# Patient Record
Sex: Female | Born: 1995 | Race: White | Hispanic: No | Marital: Single | Smoking: Never smoker
Health system: Southern US, Community
[De-identification: ages and names within clinical notes are randomized; demographics above are authoritative.]

## PROBLEM LIST (undated history)

## (undated) DIAGNOSIS — G43909 Migraine, unspecified, not intractable, without status migrainosus: Secondary | ICD-10-CM

## (undated) DIAGNOSIS — M5126 Other intervertebral disc displacement, lumbar region: Secondary | ICD-10-CM

## (undated) HISTORY — DX: Other intervertebral disc displacement, lumbar region: M51.26

## (undated) HISTORY — PX: NO PAST SURGERIES: SHX2092

## (undated) HISTORY — DX: Migraine, unspecified, not intractable, without status migrainosus: G43.909

---

## 2016-11-13 DIAGNOSIS — Z01419 Encounter for gynecological examination (general) (routine) without abnormal findings: Secondary | ICD-10-CM | POA: Diagnosis not present

## 2016-12-06 DIAGNOSIS — M5106 Intervertebral disc disorders with myelopathy, lumbar region: Secondary | ICD-10-CM | POA: Diagnosis not present

## 2016-12-15 DIAGNOSIS — M545 Low back pain: Secondary | ICD-10-CM | POA: Diagnosis not present

## 2017-05-03 DIAGNOSIS — Z23 Encounter for immunization: Secondary | ICD-10-CM | POA: Diagnosis not present

## 2017-05-17 DIAGNOSIS — S335XXA Sprain of ligaments of lumbar spine, initial encounter: Secondary | ICD-10-CM | POA: Diagnosis not present

## 2017-05-17 DIAGNOSIS — S079XXA Crushing injury of head, part unspecified, initial encounter: Secondary | ICD-10-CM | POA: Diagnosis not present

## 2017-05-17 DIAGNOSIS — S134XXA Sprain of ligaments of cervical spine, initial encounter: Secondary | ICD-10-CM | POA: Diagnosis not present

## 2017-11-16 DIAGNOSIS — Z01419 Encounter for gynecological examination (general) (routine) without abnormal findings: Secondary | ICD-10-CM | POA: Diagnosis not present

## 2017-11-16 DIAGNOSIS — Z124 Encounter for screening for malignant neoplasm of cervix: Secondary | ICD-10-CM | POA: Diagnosis not present

## 2017-11-29 DIAGNOSIS — M5416 Radiculopathy, lumbar region: Secondary | ICD-10-CM | POA: Diagnosis not present

## 2017-11-29 DIAGNOSIS — Z0000 Encounter for general adult medical examination without abnormal findings: Secondary | ICD-10-CM | POA: Diagnosis not present

## 2017-11-29 DIAGNOSIS — Z1389 Encounter for screening for other disorder: Secondary | ICD-10-CM | POA: Diagnosis not present

## 2018-02-11 DIAGNOSIS — M545 Low back pain: Secondary | ICD-10-CM | POA: Diagnosis not present

## 2018-02-11 DIAGNOSIS — M5136 Other intervertebral disc degeneration, lumbar region: Secondary | ICD-10-CM | POA: Diagnosis not present

## 2018-02-11 DIAGNOSIS — M5416 Radiculopathy, lumbar region: Secondary | ICD-10-CM | POA: Diagnosis not present

## 2018-03-01 DIAGNOSIS — M5416 Radiculopathy, lumbar region: Secondary | ICD-10-CM | POA: Diagnosis not present

## 2018-03-01 DIAGNOSIS — M545 Low back pain: Secondary | ICD-10-CM | POA: Diagnosis not present

## 2018-03-15 DIAGNOSIS — M545 Low back pain: Secondary | ICD-10-CM | POA: Diagnosis not present

## 2018-03-15 DIAGNOSIS — M5416 Radiculopathy, lumbar region: Secondary | ICD-10-CM | POA: Diagnosis not present

## 2018-03-19 DIAGNOSIS — M5416 Radiculopathy, lumbar region: Secondary | ICD-10-CM | POA: Diagnosis not present

## 2018-03-19 DIAGNOSIS — M545 Low back pain: Secondary | ICD-10-CM | POA: Diagnosis not present

## 2018-03-26 DIAGNOSIS — M545 Low back pain: Secondary | ICD-10-CM | POA: Diagnosis not present

## 2018-03-26 DIAGNOSIS — M5416 Radiculopathy, lumbar region: Secondary | ICD-10-CM | POA: Diagnosis not present

## 2018-04-03 DIAGNOSIS — M545 Low back pain: Secondary | ICD-10-CM | POA: Diagnosis not present

## 2018-04-03 DIAGNOSIS — M5416 Radiculopathy, lumbar region: Secondary | ICD-10-CM | POA: Diagnosis not present

## 2018-04-10 DIAGNOSIS — M5416 Radiculopathy, lumbar region: Secondary | ICD-10-CM | POA: Diagnosis not present

## 2018-04-10 DIAGNOSIS — M545 Low back pain: Secondary | ICD-10-CM | POA: Diagnosis not present

## 2018-04-15 DIAGNOSIS — M545 Low back pain: Secondary | ICD-10-CM | POA: Diagnosis not present

## 2018-04-15 DIAGNOSIS — M5416 Radiculopathy, lumbar region: Secondary | ICD-10-CM | POA: Diagnosis not present

## 2018-04-17 DIAGNOSIS — M545 Low back pain: Secondary | ICD-10-CM | POA: Diagnosis not present

## 2018-04-17 DIAGNOSIS — M5416 Radiculopathy, lumbar region: Secondary | ICD-10-CM | POA: Diagnosis not present

## 2018-04-24 DIAGNOSIS — M5416 Radiculopathy, lumbar region: Secondary | ICD-10-CM | POA: Diagnosis not present

## 2018-04-24 DIAGNOSIS — M545 Low back pain: Secondary | ICD-10-CM | POA: Diagnosis not present

## 2018-05-01 DIAGNOSIS — M5416 Radiculopathy, lumbar region: Secondary | ICD-10-CM | POA: Diagnosis not present

## 2018-05-01 DIAGNOSIS — M545 Low back pain: Secondary | ICD-10-CM | POA: Diagnosis not present

## 2018-05-08 DIAGNOSIS — M545 Low back pain: Secondary | ICD-10-CM | POA: Diagnosis not present

## 2018-05-08 DIAGNOSIS — M5416 Radiculopathy, lumbar region: Secondary | ICD-10-CM | POA: Diagnosis not present

## 2018-08-28 DIAGNOSIS — R102 Pelvic and perineal pain: Secondary | ICD-10-CM | POA: Diagnosis not present

## 2018-08-28 DIAGNOSIS — N946 Dysmenorrhea, unspecified: Secondary | ICD-10-CM | POA: Diagnosis not present

## 2019-02-04 ENCOUNTER — Other Ambulatory Visit: Payer: Self-pay

## 2019-02-04 ENCOUNTER — Other Ambulatory Visit: Payer: Self-pay | Admitting: Internal Medicine

## 2019-02-04 ENCOUNTER — Ambulatory Visit
Admission: RE | Admit: 2019-02-04 | Discharge: 2019-02-04 | Disposition: A | Discharge status: Home / Self Care | Payer: 59 | Source: Ambulatory Visit | Attending: Internal Medicine | Admitting: Internal Medicine

## 2019-02-04 DIAGNOSIS — R1031 Right lower quadrant pain: Secondary | ICD-10-CM

## 2019-11-29 ENCOUNTER — Ambulatory Visit: Payer: 59 | Attending: Internal Medicine

## 2019-11-29 DIAGNOSIS — Z23 Encounter for immunization: Secondary | ICD-10-CM

## 2019-11-29 NOTE — Progress Notes (Signed)
   Covid-19 Vaccination Clinic  Name:  Holly Gallagher    MRN: [id number] DOB: 04/16/1996  11/29/2019  [name] was observed post Covid-19 immunization for 15 minutes without incident. She was provided with Vaccine Information Sheet and instruction to access the V-Safe system.   [name] was instructed to call 911 with any severe reactions post vaccine: Marland Kitchen Difficulty breathing  . Swelling of face and throat  . A fast heartbeat  . A bad rash all over body  . Dizziness and weakness   Immunizations Administered    Name Date Dose VIS Date Route   Pfizer COVID-19 Vaccine 11/29/2019  9:57 AM 0.3 mL 09/03/2018 Intramuscular   Manufacturer: [employer], [employer]   Lot: [id number]   NDC: [address]-0

## 2019-12-22 ENCOUNTER — Ambulatory Visit: Payer: 59

## 2019-12-22 ENCOUNTER — Ambulatory Visit: Payer: 59 | Attending: Internal Medicine

## 2019-12-22 DIAGNOSIS — Z23 Encounter for immunization: Secondary | ICD-10-CM

## 2019-12-22 NOTE — Progress Notes (Signed)
   Covid-19 Vaccination Clinic  Name:  Runa Whittingham    MRN: [id number] DOB: 07-26-1995  12/22/2019  Ms. Durrett was observed post Covid-19 immunization for 15 minutes without incident. She was provided with Vaccine Information Sheet and instruction to access the V-Safe system.   Ms. Situ was instructed to call 911 with any severe reactions post vaccine: Marland Kitchen Difficulty breathing  . Swelling of face and throat  . A fast heartbeat  . A bad rash all over body  . Dizziness and weakness   Immunizations Administered    Name Date Dose VIS Date Route   Pfizer COVID-19 Vaccine 12/22/2019 11:29 AM 0.3 mL 09/03/2018 Intramuscular   Manufacturer: [employer], [employer]   Lot: [id number]   NDC: [address]-1

## 2019-12-31 IMAGING — CT CT ABDOMEN AND PELVIS WITHOUT CONTRAST
1 of 2 series · 14 of 32 positions shown, 19 images · non-contrast
Comparison: None.

CLINICAL DATA: Right lower quadrant pain for 1 month. Clinical
suspicion for appendicitis.

EXAM:
CT ABDOMEN AND PELVIS WITHOUT CONTRAST
TECHNIQUE: Multidetector CT imaging of the abdomen and pelvis was performed
following the standard protocol without IV contrast.

[Series 2: abd/pelvis w/(date) · axial · 0.76mm/px · z∈[-414,-4]mm · 14 of 92 slices shown, 19 images]
[im 5/92  soft-tissue]
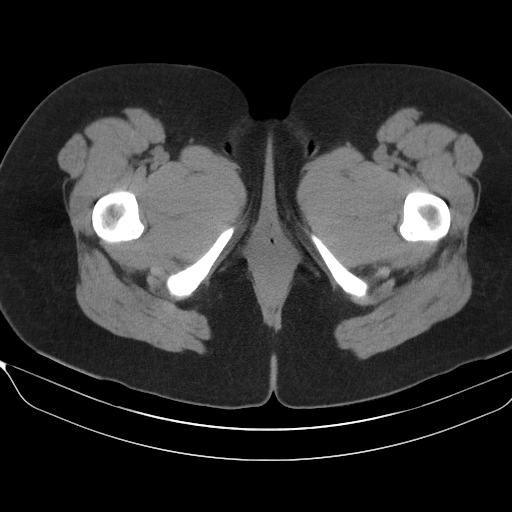
[im 5/92  bone]
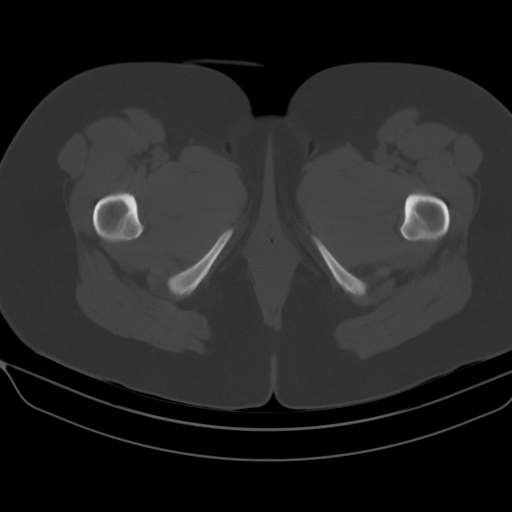
[im 15/92  soft-tissue]
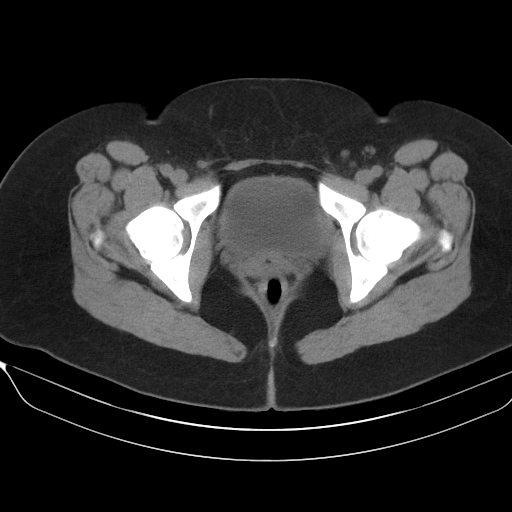
[im 20/92  soft-tissue]
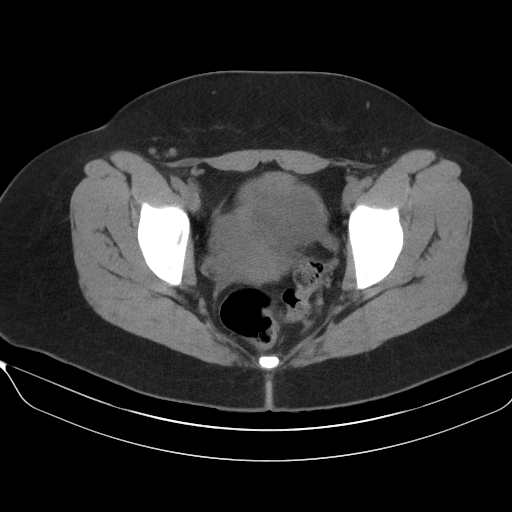
[im 24/92  soft-tissue]
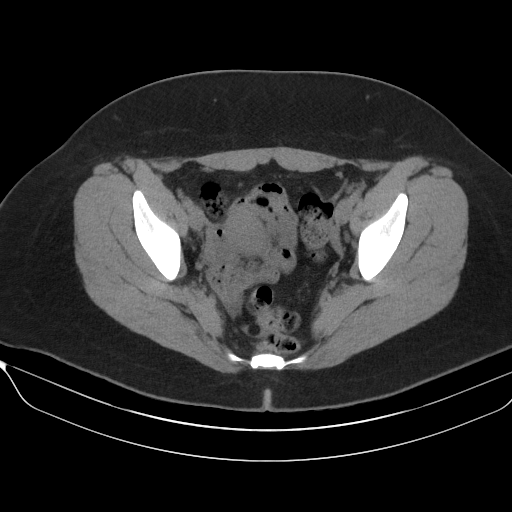
[im 34/92  soft-tissue]
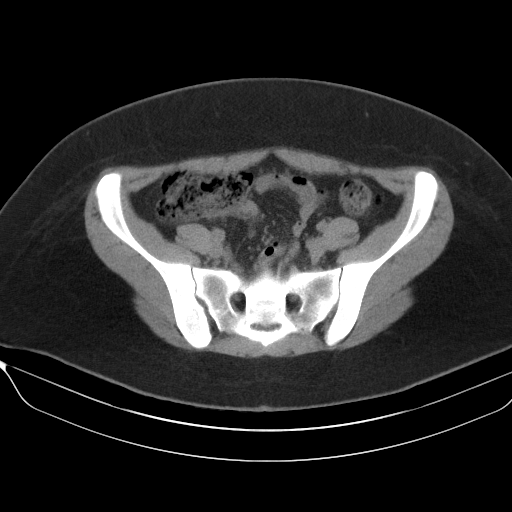
[im 39/92  soft-tissue]
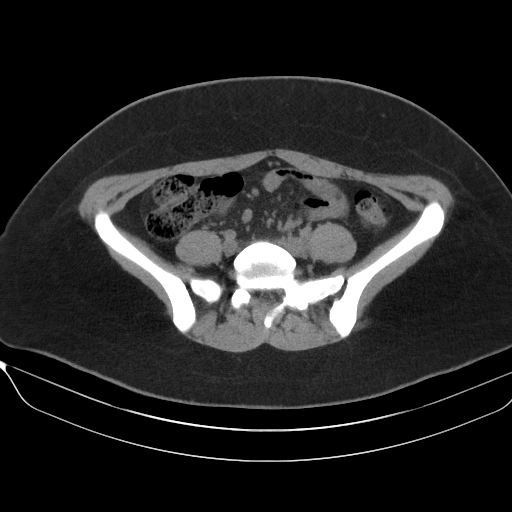
[im 48/92  soft-tissue]
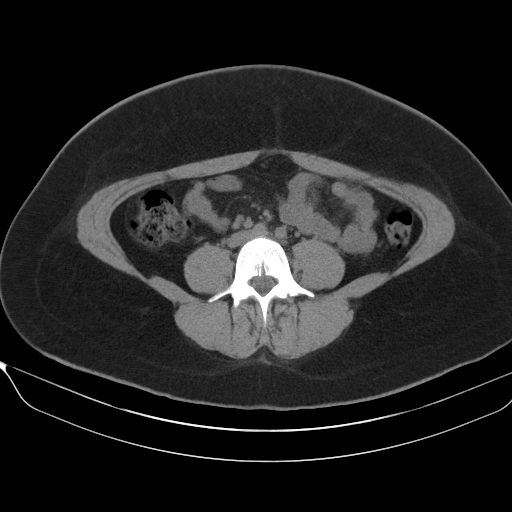
[im 53/92  soft-tissue]
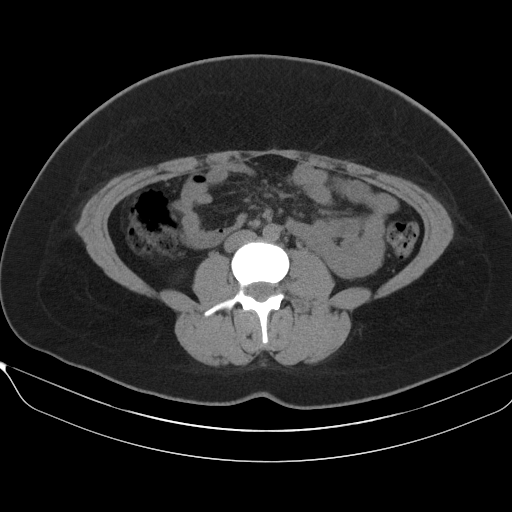
[im 58/92  soft-tissue]
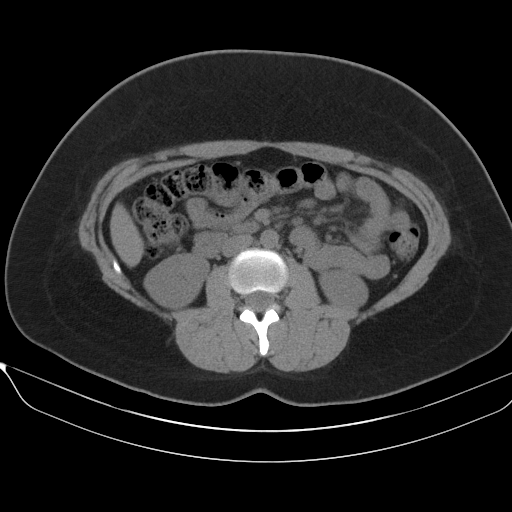
[im 58/92  bone]
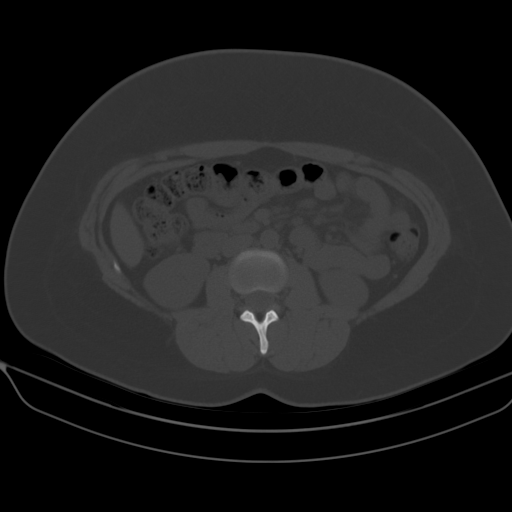
[im 68/92  soft-tissue]
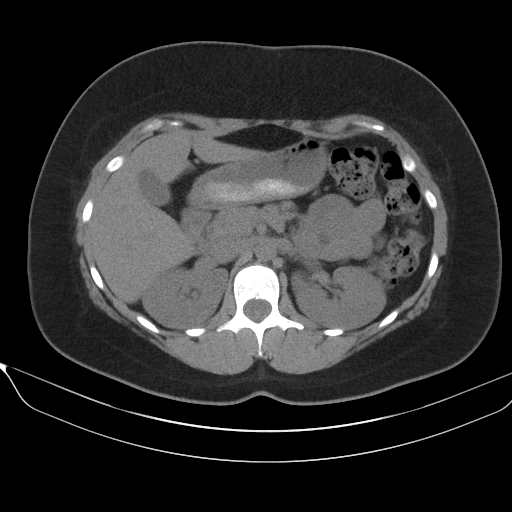
[im 72/92  soft-tissue]
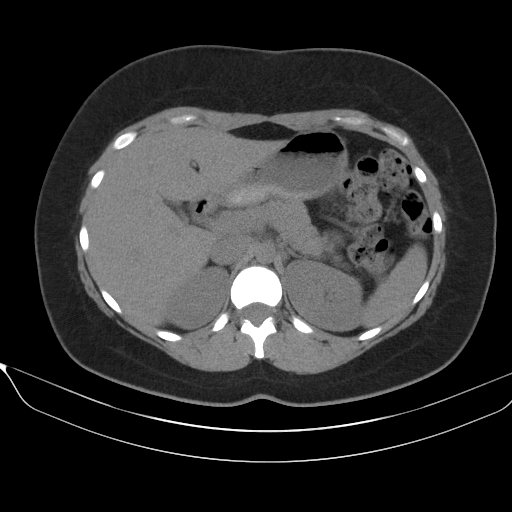
[im 72/92  lung]
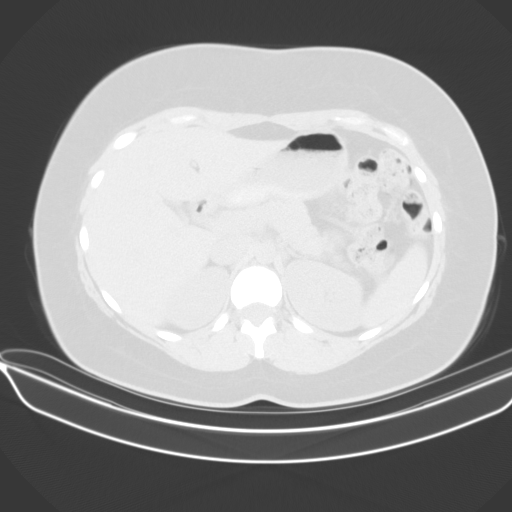
[im 77/92  soft-tissue]
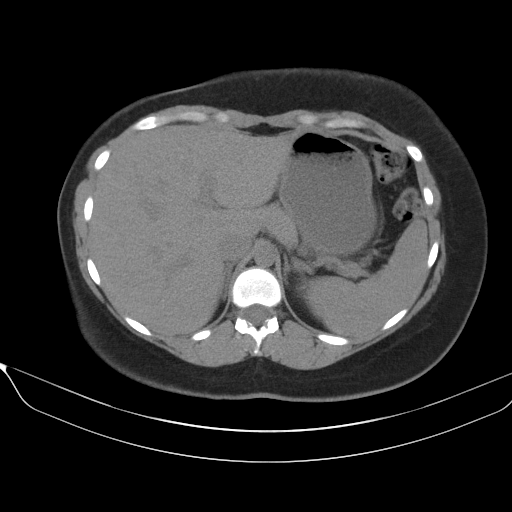
[im 77/92  lung]
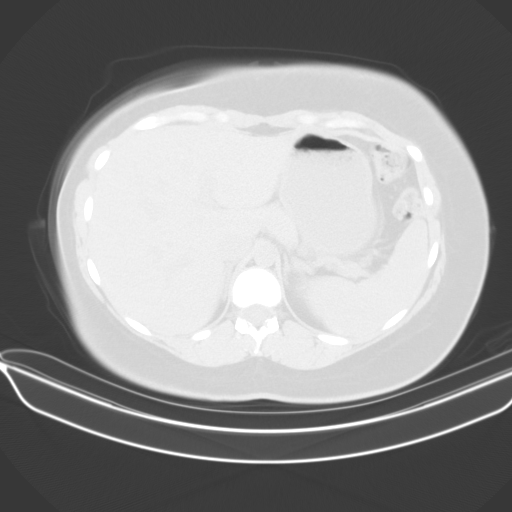
[im 82/92  lung]
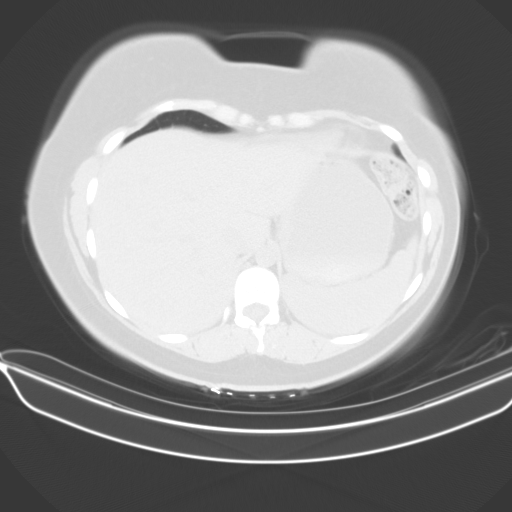
[im 87/92  soft-tissue]
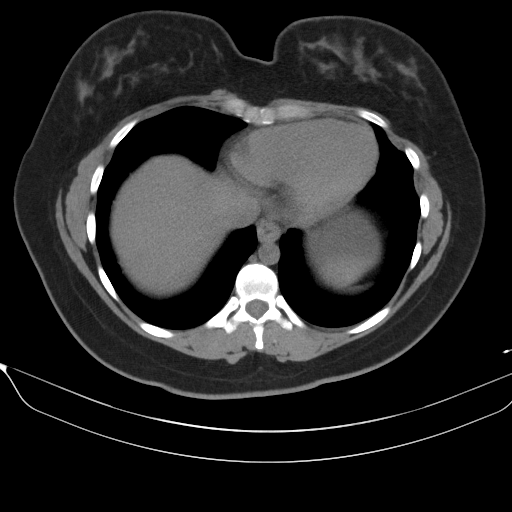
[im 87/92  lung]
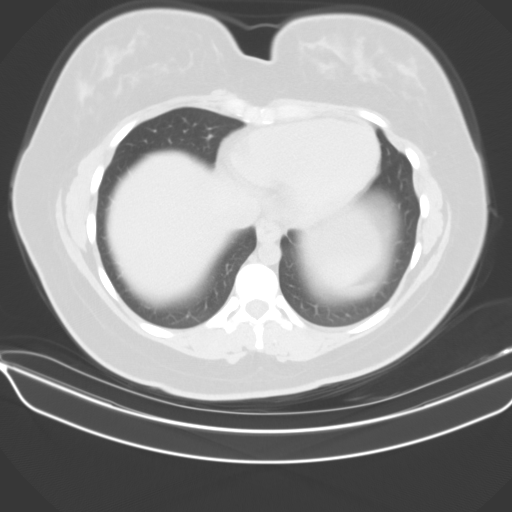

[14 of 32 positions shown; findings below may reference images not displayed]

FINDINGS: Lower chest: No acute findings.

Hepatobiliary: No mass visualized on this unenhanced exam.
Gallbladder is unremarkable.

Pancreas: No mass or inflammatory process visualized on this
unenhanced exam.

Spleen:  Within normal limits in size.

Adrenals/Urinary tract: No evidence of urolithiasis or
hydronephrosis. Unremarkable unopacified urinary bladder.

Stomach/Bowel: No evidence of obstruction, inflammatory process, or
abnormal fluid collections. Although the appendix is not directly
visualized, no inflammatory process seen in region of the cecum or
elsewhere.

Vascular/Lymphatic: No pathologically enlarged lymph nodes
identified. No evidence of abdominal aortic aneurysm.

Reproductive:  No mass or other significant abnormality.

Other:  None.

Musculoskeletal:  No suspicious bone lesions identified.
IMPRESSION: Negative.  No evidence of appendicitis or other acute findings.

## 2020-09-27 ENCOUNTER — Encounter: Payer: Self-pay | Admitting: Neurology

## 2020-09-27 ENCOUNTER — Ambulatory Visit (INDEPENDENT_AMBULATORY_CARE_PROVIDER_SITE_OTHER): Payer: 59 | Admitting: Neurology

## 2020-09-27 VITALS — BP 129/72 | HR 78 | Ht 64.0 in | Wt 205.0 lb

## 2020-09-27 DIAGNOSIS — H9311 Tinnitus, right ear: Secondary | ICD-10-CM | POA: Diagnosis not present

## 2020-09-27 DIAGNOSIS — G43701 Chronic migraine without aura, not intractable, with status migrainosus: Secondary | ICD-10-CM | POA: Diagnosis not present

## 2020-09-27 DIAGNOSIS — R51 Headache with orthostatic component, not elsewhere classified: Secondary | ICD-10-CM | POA: Diagnosis not present

## 2020-09-27 DIAGNOSIS — H539 Unspecified visual disturbance: Secondary | ICD-10-CM

## 2020-09-27 DIAGNOSIS — R519 Headache, unspecified: Secondary | ICD-10-CM | POA: Diagnosis not present

## 2020-09-27 MED ORDER — ONDANSETRON 4 MG PO TBDP: 4.0000 mg | ORAL_TABLET | Freq: Three times a day (TID) | ORAL | 3 refills | Status: DC | PRN

## 2020-09-27 MED ORDER — TOPIRAMATE 50 MG PO TABS: ORAL_TABLET | ORAL | 11 refills | Status: DC

## 2020-09-27 MED ORDER — RIZATRIPTAN BENZOATE 10 MG PO TBDP: 10.0000 mg | ORAL_TABLET | ORAL | 11 refills | Status: DC | PRN

## 2020-09-27 NOTE — Progress Notes (Signed)
[id number]    Provider:  Dr Lucia Gaskins Requesting Provider: Ailene Ards, PA* Primary Care Provider:  Alysia Penna, MD  CC:  Migraines  HPI:  Holly Gallagher is a 25 y.o. female here as requested by Ailene Ards, PA* for migraines. PMHx migraines, DDD lumbar.   Patient is here alone and reports having frequent migraines, they can last several days, have been going on since June 2021 after Covid, she denies any previous history of migraines, associated symptoms include severe nausea, occasional blurred vision, severe pressure all over, photophobia, she is tried sumatriptan hand 25 mg and 50 mg, neither help, tried Aleve. Prior to covid she used to have milder headaches, since then worsening in frequency, severity and duration. Nothing like this, this is pretty bad. She feels a lot of pressure, bilateral, severe light sensitivity, significant nausea but no vomiting, pulsating/throbbing/pounding, no sound smell sensitivity. Unknown triggers, not really found any foods or activities or weather or her period.They last days and are severe, over a week in a few instances. At least 20 headache days a month and at least 10 moderate or severe migraine days for close to a year. Progressively worse. She can wake with headaches. She has blurry vision. They are positional and worse with bending over, vision changes, also has ringing in the ears. No other focal neurologic deficits, associated symptoms, inciting events or modifiable factors.  Reviewed notes, labs and imaging from outside physicians, which showed:  CMP May 24, 2020 was normal including creatinine is 0.7 and BUN 7, CBC May 24, 2020 unremarkable, TSH same date normal 0.75.  From a review of records, Medications tried that can be used in migraine management include: Sumatriptan, Aleve, NSAIDs, diclofenac tablets, does not appear to have tried any other migraine preventatives   Review of Systems: Patient  complains of symptoms per HPI as well as the following symptoms: headache. Pertinent negatives and positives per HPI. All others negative.   Social History   Socioeconomic History  . Marital status: Single    Spouse name: Not on file  . Number of children: 0  . Years of education: Not on file  . Highest education level: Bachelor's degree (e.g., BA, AB, BS)  Occupational History  . Not on file  Tobacco Use  . Smoking status: Never Smoker  . Smokeless tobacco: Never Used  Vaping Use  . Vaping Use: Never used  Substance and Sexual Activity  . Alcohol use: Yes    Alcohol/week: 1.0 - 2.0 standard drink    Types: 1 - 2 Cans of beer per week  . Drug use: Never  . Sexual activity: Not on file  Other Topics Concern  . Not on file  Social History Narrative   Lives with parents at home   Right handed   Caffeine: 1-2 cups coffee/day   Social Determinants of Health   Financial Resource Strain: Not on file  Food Insecurity: Not on file  Transportation Needs: Not on file  Physical Activity: Not on file  Stress: Not on file  Social Connections: Not on file  Intimate Partner Violence: Not on file    Family History  Problem Relation Age of Onset  . High Cholesterol Mother   . High blood pressure Father   . Migraines Neg Hx     Past Medical History:  Diagnosis Date  . Migraines   . Ruptured lumbar disc    L4    Patient Active Problem List   Diagnosis Date Noted  .  Chronic migraine without aura with status migrainosus, not intractable 09/27/2020    Past Surgical History:  Procedure Laterality Date  . NO PAST SURGERIES      Current Outpatient Medications  Medication Sig Dispense Refill  . cetirizine (ZYRTEC) 10 MG tablet Take 10 mg by mouth daily. During spring and summer    . diclofenac (VOLTAREN) 75 MG EC tablet as needed.    Colleen Can FE 1.5/30 1.5-30 MG-MCG tablet Take 1 tablet by mouth daily.    . ondansetron (ZOFRAN-ODT) 4 MG disintegrating tablet Take 1-2  tablets (4-8 mg total) by mouth every 8 (eight) hours as needed for nausea. Also may take with Rizatriptan for migraine. 30 tablet 3  . rizatriptan (MAXALT-MLT) 10 MG disintegrating tablet Take 1 tablet (10 mg total) by mouth as needed for migraine. May repeat in 2 hours if needed 9 tablet 11  . SUMAtriptan (IMITREX) 50 MG tablet Take 50 mg by mouth 2 (two) times daily as needed.    . topiramate (TOPAMAX) 50 MG tablet 1 pill at bedtime(50mg ) and then in 2 -3 weeks increase to 2 pills(100mg ) 60 tablet 11   No current facility-administered medications for this visit.    Allergies as of 09/27/2020  . (No Known Allergies)    Vitals: BP 129/72 (BP Location: Right Arm, Patient Position: Sitting)   Pulse 78   Ht 5\' 4"  (1.626 m)   Wt 205 lb (93 kg)   BMI 35.19 kg/m  Last Weight:  Wt Readings from Last 1 Encounters:  09/27/20 205 lb (93 kg)   Last Height:   Ht Readings from Last 1 Encounters:  09/27/20 5\' 4"  (1.626 m)     Physical exam: Exam: Gen: NAD, conversant, well nourised, obese, well groomed                     CV: RRR, no MRG. No Carotid Bruits. No peripheral edema, warm, nontender Eyes: Conjunctivae clear without exudates or hemorrhage  Neuro: Detailed Neurologic Exam  Speech:    Speech is normal; fluent and spontaneous with normal comprehension.  Cognition:    The patient is oriented to person, place, and time;     recent and remote memory intact;     language fluent;     normal attention, concentration,     fund of knowledge Cranial Nerves:    The pupils are equal, round, and reactive to light. The fundi are flat Visual fields are full to finger confrontation. Extraocular movements are intact. Trigeminal sensation is intact and the muscles of mastication are normal. The face is symmetric. The palate elevates in the midline. Hearing intact. Voice is normal. Shoulder shrug is normal. The tongue has normal motion without fasciculations.   Coordination:    Normal  finger to nose   Gait:    Normal native gait  Motor Observation:    No asymmetry, no atrophy, and no involuntary movements noted. Tone:    Normal muscle tone.    Posture:    Posture is normal. normal erect    Strength:    Strength is V/V in the upper and lower limbs.      Sensation: intact to LT     Reflex Exam:  DTR's:    Deep tendon reflexes in the upper and lower extremities are normal bilaterally.   Toes:    The toes are equiv bilaterally.   Clonus:    Clonus is absent.    Assessment/Plan:  25 year old with likely chronic  migraines however given onset in the setting of covid vaccine and some concerning symptoms needs imaging.  MRI of the brain w/wo contrast: MRI brain due to concerning symptoms of morning headaches, positional headaches,vision changes, hearing changes  to look for space occupying mass, chiari or intracranial hypertension (pseudotumor).  Prevention: Start Topiramate: 1 pill at bedtime(50mg ) and then in 2 -3 weeks increase to 2 pills(100mg ). Could try   Acute management: Rizatriptan and Ondansetron: Please take tablets at the onset of your headache. If it does not improve the symptoms please take again in 2 hours. Do not take more then 2 tablets in 24hrs. Do not take use more then 2 to 3 times in a week.  Orders Placed This Encounter  Procedures  . MR BRAIN W WO CONTRAST   Meds ordered this encounter  Medications  . rizatriptan (MAXALT-MLT) 10 MG disintegrating tablet    Sig: Take 1 tablet (10 mg total) by mouth as needed for migraine. May repeat in 2 hours if needed    Dispense:  9 tablet    Refill:  11  . topiramate (TOPAMAX) 50 MG tablet    Sig: 1 pill at bedtime(50mg ) and then in 2 -3 weeks increase to 2 pills(100mg )    Dispense:  60 tablet    Refill:  11  . ondansetron (ZOFRAN-ODT) 4 MG disintegrating tablet    Sig: Take 1-2 tablets (4-8 mg total) by mouth every 8 (eight) hours as needed for nausea. Also may take with Rizatriptan for  migraine.    Dispense:  30 tablet    Refill:  3    Cc: Ailene Ards, PA*,  Alysia Penna, MD  Naomie Dean, MD  [facility] [address] [place], [place] [address]  Phone [phone] Fax [phone]

## 2020-09-27 NOTE — Patient Instructions (Addendum)
Prevention: Start Topiramate: 1 pill at bedtime(50mg ) and then in 2 -3 weeks increase to 2 pills(100mg ) Acute management: Rizatriptan and Ondansetron: Please take tablets at the onset of your headache. If it does not improve the symptoms please take again in 2 hours. Do not take more then 2 tablets in 24hrs. Do not take use more then 2 to 3 times in a week. MRI of the brain w/wo contrast: will call F/u 3 months video  Topiramate tablets What is this medicine? TOPIRAMATE (toe PYRE a mate) is used to treat seizures in adults or children with epilepsy. It is also used for the prevention of migraine headaches. This medicine may be used for other purposes; ask your health care provider or pharmacist if you have questions. COMMON BRAND NAME(S): Topamax, Topiragen What should I tell my health care provider before I take this medicine? They need to know if you have any of these conditions:  bleeding disorder  kidney disease  lung disease  suicidal thoughts, plans, or attempt  an unusual or allergic reaction to topiramate, other medicines, foods, dyes, or preservatives  pregnant or trying to get pregnant  breast-feeding How should I use this medicine? Take this medicine by mouth with a glass of water. Follow the directions on the prescription label. Do not cut, crush or chew this medicine. Swallow the tablets whole. You can take it with or without food. If it upsets your stomach, take it with food. Take your medicine at regular intervals. Do not take it more often than directed. Do not stop taking except on your doctor's advice. A special MedGuide will be given to you by the pharmacist with each prescription and refill. Be sure to read this information carefully each time. Talk to your pediatrician regarding the use of this medicine in children. While this drug may be prescribed for children as young as 75 years of age for selected conditions, precautions do apply. Overdosage: If you think you  have taken too much of this medicine contact a poison control center or emergency room at once. NOTE: This medicine is only for you. Do not share this medicine with others. What if I miss a dose? If you miss a dose, take it as soon as you can. If your next dose is to be taken in less than 6 hours, then do not take the missed dose. Take the next dose at your regular time. Do not take double or extra doses. What may interact with this medicine? This medicine may interact with the following medications:  acetazolamide  alcohol  antihistamines for allergy, cough, and cold  aspirin and aspirin-like medicines  atropine  birth control pills  certain medicines for anxiety or sleep  certain medicines for bladder problems like oxybutynin, tolterodine  certain medicines for depression like amitriptyline, fluoxetine, sertraline  certain medicines for seizures like carbamazepine, phenobarbital, phenytoin, primidone, valproic acid, zonisamide  certain medicines for stomach problems like dicyclomine, hyoscyamine  certain medicines for travel sickness like scopolamine  certain medicines for Parkinson's disease like benztropine, trihexyphenidyl  certain medicines that treat or prevent blood clots like warfarin, enoxaparin, dalteparin, apixaban, dabigatran, and rivaroxaban  digoxin  general anesthetics like halothane, isoflurane, methoxyflurane, propofol  hydrochlorothiazide  ipratropium  lithium  medicines that relax muscles for surgery  metformin  narcotic medicines for pain  NSAIDs, medicines for pain and inflammation, like ibuprofen or naproxen  phenothiazines like chlorpromazine, mesoridazine, prochlorperazine, thioridazine  pioglitazone This list may not describe all possible interactions. Give your health care provider  a list of all the medicines, herbs, non-prescription drugs, or dietary supplements you use. Also tell them if you smoke, drink alcohol, or use illegal  drugs. Some items may interact with your medicine. What should I watch for while using this medicine? Visit your doctor or health care professional for regular checks on your progress. Tell your health care professional if your symptoms do not start to get better or if they get worse. Do not stop taking except on your health care professional's advice. You may develop a severe reaction. Your health care professional will tell you how much medicine to take. Wear a medical ID bracelet or chain. Carry a card that describes your disease and details of your medicine and dosage times. This medicine can reduce the response of your body to heat or cold. Dress warm in cold weather and stay hydrated in hot weather. If possible, avoid extreme temperatures like saunas, hot tubs, very hot or cold showers, or activities that can cause dehydration such as vigorous exercise. Check with your health care professional if you have severe diarrhea, nausea, and vomiting, or if you sweat a lot. The loss of too much body fluid may make it dangerous for you to take this medicine. You may get drowsy or dizzy. Do not drive, use machinery, or do anything that needs mental alertness until you know how this medicine affects you. Do not stand up or sit up quickly, especially if you are an older patient. This reduces the risk of dizzy or fainting spells. Alcohol may interfere with the effect of this medicine. Avoid alcoholic drinks. Tell your health care professional right away if you have any change in your eyesight. Patients and their families should watch out for new or worsening depression or thoughts of suicide. Also watch out for sudden changes in feelings such as feeling anxious, agitated, panicky, irritable, hostile, aggressive, impulsive, severely restless, overly excited and hyperactive, or not being able to sleep. If this happens, especially at the beginning of treatment or after a change in dose, call your healthcare  professional. This medicine may cause serious skin reactions. They can happen weeks to months after starting the medicine. Contact your health care provider right away if you notice fevers or flu-like symptoms with a rash. The rash may be red or purple and then turn into blisters or peeling of the skin. Or, you might notice a red rash with swelling of the face, lips or lymph nodes in your neck or under your arms. Birth control may not work properly while you are taking this medicine. Talk to your health care professional about using an extra method of birth control. Women should inform their health care professional if they wish to become pregnant or think they might be pregnant. There is a potential for serious side effects and harm to an unborn child. Talk to your health care professional for more information. What side effects may I notice from receiving this medicine? Side effects that you should report to your doctor or health care professional as soon as possible:  allergic reactions like skin rash, itching or hives, swelling of the face, lips, or tongue  blood in the urine  changes in vision  confusion  loss of memory  pain in lower back or side  pain when urinating  redness, blistering, peeling or loosening of the skin, including inside the mouth  signs and symptoms of bleeding such as bloody or black, tarry stools; red or dark brown urine; spitting up blood or  brown material that looks like coffee grounds; red spots on the skin; unusual bruising or bleeding from the eyes, gums, or nose  signs and symptoms of increased acid in the body like breathing fast; fast heartbeat; headache; confusion; unusually weak or tired; nausea, vomiting  suicidal thoughts, mood changes  trouble speaking or understanding  unusual sweating  unusually weak or tired Side effects that usually do not require medical attention (report to your doctor or health care professional if they continue or are  bothersome):  dizziness  drowsiness  fever  loss of appetite  nausea, vomiting  pain, tingling, numbness in the hands or feet  stomach pain  tiredness  upset stomach This list may not describe all possible side effects. Call your doctor for medical advice about side effects. You may report side effects to FDA at 1-800-FDA-1088. Where should I keep my medicine? Keep out of the reach of children and pets. Store between 15 and 30 degrees C (59 and 86 degrees F). Protect from moisture. Keep the container tightly closed. Get rid of any unused medicine after the expiration date. To get rid of medicines that are no longer needed or have expired:  Take the medicine to a medicine take-back program. Check with your pharmacy or law enforcement to find a location.  If you cannot return the medicine, check the label or package insert to see if the medicine should be thrown out in the garbage or flushed down the toilet. If you are not sure, ask your health care provider. If it is safe to put it in the trash, empty the medicine out of the container. Mix the medicine with cat litter, dirt, coffee grounds, or other unwanted substance. Seal the mixture in a bag or container. Put it in the trash. NOTE: This sheet is a summary. It may not cover all possible information. If you have questions about this medicine, talk to your doctor, pharmacist, or health care provider.  2021 Elsevier/Gold Standard (2020-01-08 15:41:57) Ondansetron oral dissolving tablet What is this medicine? ONDANSETRON (on DAN se tron) is used to treat nausea and vomiting caused by chemotherapy. It is also used to prevent or treat nausea and vomiting after surgery. This medicine may be used for other purposes; ask your health care provider or pharmacist if you have questions. COMMON BRAND NAME(S): Zofran ODT What should I tell my health care provider before I take this medicine? They need to know if you have any of these  conditions:  heart disease  history of irregular heartbeat  liver disease  low levels of magnesium or potassium in the blood  an unusual or allergic reaction to ondansetron, granisetron, other medicines, foods, dyes, or preservatives  pregnant or trying to get pregnant  breast-feeding How should I use this medicine? These tablets are made to dissolve in the mouth. Do not try to push the tablet through the foil backing. With dry hands, peel away the foil backing and gently remove the tablet. Place the tablet in the mouth and allow it to dissolve, then swallow. While you may take these tablets with water, it is not necessary to do so. Talk to your pediatrician regarding the use of this medicine in children. Special care may be needed. Overdosage: If you think you have taken too much of this medicine contact a poison control center or emergency room at once. NOTE: This medicine is only for you. Do not share this medicine with others. What if I miss a dose? If you miss a  dose, take it as soon as you can. If it is almost time for your next dose, take only that dose. Do not take double or extra doses. What may interact with this medicine? Do not take this medicine with any of the following medications:  apomorphine  certain medicines for fungal infections like fluconazole, itraconazole, ketoconazole, posaconazole, voriconazole  cisapride  dronedarone  pimozide  thioridazine This medicine may also interact with the following medications:  carbamazepine  certain medicines for depression, anxiety, or psychotic disturbances  fentanyl  linezolid  MAOIs like Carbex, Eldepryl, Marplan, Nardil, and Parnate  methylene blue (injected into a vein)  other medicines that prolong the QT interval (cause an abnormal heart rhythm) like dofetilide, ziprasidone  phenytoin  rifampicin  tramadol This list may not describe all possible interactions. Give your health care provider a list  of all the medicines, herbs, non-prescription drugs, or dietary supplements you use. Also tell them if you smoke, drink alcohol, or use illegal drugs. Some items may interact with your medicine. What should I watch for while using this medicine? Check with your doctor or health care professional as soon as you can if you have any sign of an allergic reaction. What side effects may I notice from receiving this medicine? Side effects that you should report to your doctor or health care professional as soon as possible:  allergic reactions like skin rash, itching or hives, swelling of the face, lips, or tongue  breathing problems  confusion  dizziness  fast or irregular heartbeat  feeling faint or lightheaded, falls  fever and chills  loss of balance or coordination  seizures  sweating  swelling of the hands and feet  tightness in the chest  tremors  unusually weak or tired Side effects that usually do not require medical attention (report to your doctor or health care professional if they continue or are bothersome):  constipation or diarrhea  headache This list may not describe all possible side effects. Call your doctor for medical advice about side effects. You may report side effects to FDA at 1-800-FDA-1088. Where should I keep my medicine? Keep out of the reach of children. Store between 2 and 30 degrees C (36 and 86 degrees F). Throw away any unused medicine after the expiration date. NOTE: This sheet is a summary. It may not cover all possible information. If you have questions about this medicine, talk to your doctor, pharmacist, or health care provider.  2021 Elsevier/Gold Standard (2018-06-18 07:14:10) Rizatriptan disintegrating tablets What is this medicine? RIZATRIPTAN (rye za TRIP tan) is used to treat migraines with or without aura. An aura is a strange feeling or visual disturbance that warns you of an attack. It is not used to prevent migraines. This  medicine may be used for other purposes; ask your health care provider or pharmacist if you have questions. COMMON BRAND NAME(S): Maxalt-MLT What should I tell my health care provider before I take this medicine? They need to know if you have any of these conditions:  cigarette smoker  circulation problems in fingers and toes  diabetes  heart disease  high blood pressure  high cholesterol  history of irregular heartbeat  history of stroke  kidney disease  liver disease  stomach or intestine problems  an unusual or allergic reaction to rizatriptan, other medicines, foods, dyes, or preservatives  pregnant or trying to get pregnant  breast-feeding How should I use this medicine? Take this medicine by mouth. Follow the directions on the prescription label.  Leave the tablet in the sealed blister pack until you are ready to take it. With dry hands, open the blister and gently remove the tablet. If the tablet breaks or crumbles, throw it away and take a new tablet out of the blister pack. Place the tablet in the mouth and allow it to dissolve, and then swallow. Do not cut, crush, or chew this medicine. You do not need water to take this medicine. Do not take it more often than directed. Talk to your pediatrician regarding the use of this medicine in children. While this drug may be prescribed for children as young as 6 years for selected conditions, precautions do apply. Overdosage: If you think you have taken too much of this medicine contact a poison control center or emergency room at once. NOTE: This medicine is only for you. Do not share this medicine with others. What if I miss a dose? This does not apply. This medicine is not for regular use. What may interact with this medicine? Do not take this medicine with any of the following medicines:  certain medicines for migraine headache like almotriptan, eletriptan, frovatriptan, naratriptan, rizatriptan, sumatriptan,  zolmitriptan  ergot alkaloids like dihydroergotamine, ergonovine, ergotamine, methylergonovine  MAOIs like Carbex, Eldepryl, Marplan, Nardil, and Parnate This medicine may also interact with the following medications:  certain medicines for depression, anxiety, or psychotic disorders  propranolol This list may not describe all possible interactions. Give your health care provider a list of all the medicines, herbs, non-prescription drugs, or dietary supplements you use. Also tell them if you smoke, drink alcohol, or use illegal drugs. Some items may interact with your medicine. What should I watch for while using this medicine? Visit your healthcare professional for regular checks on your progress. Tell your healthcare professional if your symptoms do not start to get better or if they get worse. You may get drowsy or dizzy. Do not drive, use machinery, or do anything that needs mental alertness until you know how this medicine affects you. Do not stand up or sit up quickly, especially if you are an older patient. This reduces the risk of dizzy or fainting spells. Alcohol may interfere with the effect of this medicine. Your mouth may get dry. Chewing sugarless gum or sucking hard candy and drinking plenty of water may help. Contact your healthcare professional if the problem does not go away or is severe. If you take migraine medicines for 10 or more days a month, your migraines may get worse. Keep a diary of headache days and medicine use. Contact your healthcare professional if your migraine attacks occur more frequently. What side effects may I notice from receiving this medicine? Side effects that you should report to your doctor or health care professional as soon as possible:  allergic reactions like skin rash, itching or hives, swelling of the face, lips, or tongue  chest pain or chest tightness  signs and symptoms of a dangerous change in heartbeat or heart rhythm like chest pain;  dizziness; fast, irregular heartbeat; palpitations; feeling faint or lightheaded; falls; breathing problems  signs and symptoms of a stroke like changes in vision; confusion; trouble speaking or understanding; severe headaches; sudden numbness or weakness of the face, arm or leg; trouble walking; dizziness; loss of balance or coordination  signs and symptoms of serotonin syndrome like irritable; confusion; diarrhea; fast or irregular heartbeat; muscle twitching; stiff muscles; trouble walking; sweating; high fever; seizures; chills; vomiting Side effects that usually do not require medical attention (report  to your doctor or health care professional if they continue or are bothersome):  diarrhea  dizziness  drowsiness  dry mouth  headache  nausea, vomiting  pain, tingling, numbness in the hands or feet  stomach pain This list may not describe all possible side effects. Call your doctor for medical advice about side effects. You may report side effects to FDA at 1-800-FDA-1088. Where should I keep my medicine? Keep out of the reach of children. Store at room temperature between 15 and 30 degrees C (59 and 86 degrees F). Protect from light and moisture. Throw away any unused medicine after the expiration date. NOTE: This sheet is a summary. It may not cover all possible information. If you have questions about this medicine, talk to your doctor, pharmacist, or health care provider.  2021 [employer]vier/Gold Standard (2018-01-08 14:58:08)

## 2020-09-28 ENCOUNTER — Telehealth: Payer: Self-pay | Admitting: Neurology

## 2020-09-28 NOTE — Telephone Encounter (Signed)
09/28/20 LVM to schedule MRI 09/28/20 NPR via [facility] website JM

## 2020-09-29 ENCOUNTER — Telehealth: Payer: Self-pay | Admitting: Neurology

## 2020-09-29 NOTE — Telephone Encounter (Signed)
45 mins MR brain w wo contrast Dr. Lucia Gaskins answered no to covid questions NPR via [facility] website  Scheduled at [facility] for 10/06/20

## 2020-10-06 ENCOUNTER — Ambulatory Visit: Payer: 59

## 2020-10-06 DIAGNOSIS — R519 Headache, unspecified: Secondary | ICD-10-CM

## 2020-10-06 DIAGNOSIS — H539 Unspecified visual disturbance: Secondary | ICD-10-CM | POA: Diagnosis not present

## 2020-10-06 DIAGNOSIS — R51 Headache with orthostatic component, not elsewhere classified: Secondary | ICD-10-CM

## 2020-10-06 DIAGNOSIS — H9311 Tinnitus, right ear: Secondary | ICD-10-CM | POA: Diagnosis not present

## 2020-10-06 MED ORDER — GADOBENATE DIMEGLUMINE 529 MG/ML IV SOLN
20.0000 mL | Freq: Once | INTRAVENOUS | Status: AC | PRN
Start: 2020-10-06 — End: 2020-10-06
  Administered 2020-10-06: 20 mL via INTRAVENOUS

## 2020-12-27 ENCOUNTER — Telehealth: Payer: 59 | Admitting: Neurology

## 2021-07-23 ENCOUNTER — Encounter: Payer: Self-pay | Admitting: Neurology

## 2021-07-27 ENCOUNTER — Telehealth (INDEPENDENT_AMBULATORY_CARE_PROVIDER_SITE_OTHER): Payer: BC Managed Care – PPO | Admitting: Adult Health

## 2021-07-27 ENCOUNTER — Encounter: Payer: Self-pay | Admitting: Adult Health

## 2021-07-27 ENCOUNTER — Telehealth: Payer: Self-pay | Admitting: Neurology

## 2021-07-27 DIAGNOSIS — G43701 Chronic migraine without aura, not intractable, with status migrainosus: Secondary | ICD-10-CM

## 2021-07-27 MED ORDER — ONDANSETRON 4 MG PO TBDP: 4.0000 mg | ORAL_TABLET | Freq: Three times a day (TID) | ORAL | 11 refills | Status: AC | PRN

## 2021-07-27 MED ORDER — RIZATRIPTAN BENZOATE 10 MG PO TBDP: 10.0000 mg | ORAL_TABLET | ORAL | 11 refills | Status: AC | PRN

## 2021-07-27 MED ORDER — TOPIRAMATE 100 MG PO TABS: 100.0000 mg | ORAL_TABLET | Freq: Every day | ORAL | 3 refills | Status: AC

## 2021-07-27 NOTE — Progress Notes (Addendum)
[facility]    Provider:  Dr Lucia Gaskins Requesting Provider: Alysia Penna, MD Primary Care Provider:  Alysia Penna, MD  Virtual Visit via Video Note  Virtual visit completed through MyChart, a video enabled telemedicine application. Due to national recommendations of social distancing due to COVID-19, a virtual visit is felt to be most appropriate for this patient at this time. Reviewed limitations, risks, security and privacy concerns of performing a virtual visit and the availability of in person appointments. I also reviewed that there may be a patient responsible charge related to this service. The patient agreed to proceed.    Patient location: home Provider location: in office, [facility] Persons participating in this virtual visit: patient      CC:  Migraines  HPI:    Update 07/27/2021 JM: returns for f/u visit after initial visit with Dr. Lucia Gaskins 10 months ago. Being seen via MyChart video visit. Started on topamax currently taking 100mg  nightly. Denies side effects. Migraines have greatly improved, occurring maybe once every other month or once every 3 months. Will use Rizatriptan as needed with benefit and denies side effects. She requests refills. No concerns at this time.     Initial visit 09/27/20 Dr. 09/29/20: Lummie Montijo is a 26 y.o. female here as requested by 30, MD for migraines. PMHx migraines, DDD lumbar.   Patient is here alone and reports having frequent migraines, they can last several days, have been going on since June 2021 after Covid, she denies any previous history of migraines, associated symptoms include severe nausea, occasional blurred vision, severe pressure all over, photophobia, she is tried sumatriptan hand 25 mg and 50 mg, neither help, tried Aleve. Prior to covid she used to have milder headaches, since then worsening in frequency, severity and duration. Nothing like this, this is pretty bad. She  feels a lot of pressure, bilateral, severe light sensitivity, significant nausea but no vomiting, pulsating/throbbing/pounding, no sound smell sensitivity. Unknown triggers, not really found any foods or activities or weather or her period.They last days and are severe, over a week in a few instances. At least 20 headache days a month and at least 10 moderate or severe migraine days for close to a year. Progressively worse. She can wake with headaches. She has blurry vision. They are positional and worse with bending over, vision changes, also has ringing in the ears. No other focal neurologic deficits, associated symptoms, inciting events or modifiable factors.  Reviewed notes, labs and imaging from outside physicians, which showed:  CMP May 24, 2020 was normal including creatinine is 0.7 and BUN 7, CBC May 24, 2020 unremarkable, TSH same date normal 0.75.  From a review of records, Medications tried that can be used in migraine management include: Sumatriptan, Aleve, NSAIDs, diclofenac tablets, does not appear to have tried any other migraine preventatives   Review of Systems: Patient complains of symptoms per HPI as well as the following symptoms: no complaints. Pertinent negatives and positives per HPI. All others negative.   Social History   Socioeconomic History   Marital status: Single    Spouse name: Not on file   Number of children: 0   Years of education: Not on file   Highest education level: Bachelor's degree (e.g., BA, AB, BS)  Occupational History   Not on file  Tobacco Use   Smoking status: Never   Smokeless tobacco: Never  Vaping Use   Vaping Use: Never used  Substance and Sexual Activity   Alcohol use:  Yes    Alcohol/week: 1.0 - 2.0 standard drink    Types: 1 - 2 Cans of beer per week   Drug use: Never   Sexual activity: Not on file  Other Topics Concern   Not on file  Social History Narrative   Lives with parents at home   Right handed   Caffeine:  1-2 cups coffee/day   Social Determinants of Health   Financial Resource Strain: Not on file  Food Insecurity: Not on file  Transportation Needs: Not on file  Physical Activity: Not on file  Stress: Not on file  Social Connections: Not on file  Intimate Partner Violence: Not on file    Family History  Problem Relation Age of Onset   High Cholesterol Mother    High blood pressure Father    Migraines Neg Hx     Past Medical History:  Diagnosis Date   Migraines    Ruptured lumbar disc    L4    Patient Active Problem List   Diagnosis Date Noted   Chronic migraine without aura with status migrainosus, not intractable 09/27/2020    Past Surgical History:  Procedure Laterality Date   NO PAST SURGERIES      Current Outpatient Medications  Medication Sig Dispense Refill   cetirizine (ZYRTEC) 10 MG tablet Take 10 mg by mouth daily. During spring and summer     diclofenac (VOLTAREN) 75 MG EC tablet as needed.     JUNEL FE 1.5/30 1.5-30 MG-MCG tablet Take 1 tablet by mouth daily.     ondansetron (ZOFRAN-ODT) 4 MG disintegrating tablet Take 1-2 tablets (4-8 mg total) by mouth every 8 (eight) hours as needed for nausea. Also may take with Rizatriptan for migraine. 30 tablet 11   rizatriptan (MAXALT-MLT) 10 MG disintegrating tablet Take 1 tablet (10 mg total) by mouth as needed for migraine. May repeat in 2 hours if needed, max dose 20mg /24 hrs 9 tablet 11   topiramate (TOPAMAX) 100 MG tablet Take 1 tablet (100 mg total) by mouth at bedtime. 90 tablet 3   No current facility-administered medications for this visit.    Allergies as of 07/27/2021   (No Known Allergies)      Physical exam:  N/a due to visit type      Assessment/Plan:  26 year old with chronic migraines followed by Dr. 30. Doing very well managed on topamax  Prevention: Continue Topiramate 100mg  nightly - refill provided Acute: Continue Rizatriptan and Ondansetron - refills provided. Please take  tablets at the onset of your headache. If it does not improve the symptoms please take again in 2 hours. Do not take more then 2 tablets in 24hrs. Do not take use more then 2 to 3 times in a week.   Follow up in 1 year or earlier if needed   No orders of the defined types were placed in this encounter.  Meds ordered this encounter  Medications   topiramate (TOPAMAX) 100 MG tablet    Sig: Take 1 tablet (100 mg total) by mouth at bedtime.    Dispense:  90 tablet    Refill:  3   rizatriptan (MAXALT-MLT) 10 MG disintegrating tablet    Sig: Take 1 tablet (10 mg total) by mouth as needed for migraine. May repeat in 2 hours if needed, max dose 20mg /24 hrs    Dispense:  9 tablet    Refill:  11   ondansetron (ZOFRAN-ODT) 4 MG disintegrating tablet    Sig: Take 1-2  tablets (4-8 mg total) by mouth every 8 (eight) hours as needed for nausea. Also may take with Rizatriptan for migraine.    Dispense:  30 tablet    Refill:  11    Cc: Alysia PennaHolwerda, Scott, MD  I spent 21 minutes of face-to-face and non-face-to-face time with patient via MyChart video visit  This included previsit chart review, lab review, study review, order entry, electronic health record documentation, patient education and discussion regarding chronic migraines, current medication use and answered all other questions to patients satisfaction   Ihor AustinJessica McCue, [facility] ColeGNP-BC  [facility] [address] [place], [place] [id number]  Phone [phone] Fax [phone] Note: This document was prepared with digital dictation and possible smart phrase technology. Any transcriptional errors that result from this process are unintentional.

## 2021-07-27 NOTE — Telephone Encounter (Signed)
Contacted pt to remind of MyChart Video Visit. Pt provided new insurance information  [employer] Member ID: [id number] Group# [id number] BIN: [id number]

## 2021-09-21 ENCOUNTER — Other Ambulatory Visit: Payer: Self-pay | Admitting: Neurology

## 2022-04-01 ENCOUNTER — Ambulatory Visit (INDEPENDENT_AMBULATORY_CARE_PROVIDER_SITE_OTHER): Payer: BC Managed Care – PPO

## 2022-04-01 ENCOUNTER — Encounter (HOSPITAL_COMMUNITY): Payer: Self-pay | Admitting: *Deleted

## 2022-04-01 ENCOUNTER — Ambulatory Visit (HOSPITAL_COMMUNITY)
Admission: EM | Admit: 2022-04-01 | Discharge: 2022-04-01 | Disposition: A | Discharge status: Home / Self Care | Payer: BC Managed Care – PPO | Attending: Cardiovascular Disease | Admitting: Cardiovascular Disease

## 2022-04-01 ENCOUNTER — Other Ambulatory Visit: Payer: Self-pay

## 2022-04-01 DIAGNOSIS — S99921A Unspecified injury of right foot, initial encounter: Secondary | ICD-10-CM

## 2022-04-01 DIAGNOSIS — M79671 Pain in right foot: Secondary | ICD-10-CM

## 2022-04-01 NOTE — Discharge Instructions (Addendum)
Rest - try to avoid heavy lifting and high impact activity Ice - apply for 20 minutes a few times daily (wear socks!) Elevation - prop up on a pillow  You can take up to 800 mg ibuprofen every 6 hours as needed for pain and inflammation.  I recommend wearing supportive, closed toed shoes.  Follow up with the orthopedic specialists if symptoms persist despite medication.

## 2022-04-01 NOTE — ED Notes (Signed)
Provider notified visa secure chat that Pt declines urine preg test

## 2022-04-01 NOTE — ED Provider Notes (Signed)
[place]    CSN: [id number] Arrival date & time: 04/01/22  1447     History   Chief Complaint Chief Complaint  Patient presents with   Foot Orthotics    HPI Holly Gallagher is a 26 y.o. female.  Presents with a right foot injury Reports her horse stepped on her right great toe 5/10 pain She was wearing boots at the time of incident   Pain with weight bearing  No medications PTA but has been applying ice  Past Medical History:  Diagnosis Date   Migraines    Ruptured lumbar disc    L4    Patient Active Problem List   Diagnosis Date Noted   Chronic migraine without aura with status migrainosus, not intractable 09/27/2020    Past Surgical History:  Procedure Laterality Date   NO PAST SURGERIES      OB History   No obstetric history on file.      Home Medications    Prior to Admission medications   Medication Sig Start Date End Date Taking? Authorizing Provider  cetirizine (ZYRTEC) 10 MG tablet Take 10 mg by mouth daily. During spring and summer    [provider]  diclofenac (VOLTAREN) 75 MG EC tablet as needed.    [provider]  JUNEL FE 1.5/30 1.5-30 MG-MCG tablet Take 1 tablet by mouth daily. 08/11/20   [provider]  ondansetron (ZOFRAN-ODT) 4 MG disintegrating tablet Take 1-2 tablets (4-8 mg total) by mouth every 8 (eight) hours as needed for nausea. Also may take with Rizatriptan for migraine. 07/27/21   Frann Rider, NP  rizatriptan (MAXALT-MLT) 10 MG disintegrating tablet Take 1 tablet (10 mg total) by mouth as needed for migraine. May repeat in 2 hours if needed, max dose 20mg /24 hrs 07/27/21   Frann Rider, NP  topiramate (TOPAMAX) 100 MG tablet Take 1 tablet (100 mg total) by mouth at bedtime. 07/27/21   Frann Rider, NP    Family History Family History  Problem Relation Age of Onset   High Cholesterol Mother    High blood pressure Father    Migraines Neg Hx     Social History Social History    Tobacco Use   Smoking status: Never   Smokeless tobacco: Never  Vaping Use   Vaping Use: Never used  Substance Use Topics   Alcohol use: Yes    Alcohol/week: 1.0 - 2.0 standard drink of alcohol    Types: 1 - 2 Cans of beer per week   Drug use: Never     Allergies   Patient has no known allergies.   Review of Systems Review of Systems Pre HPI  Physical Exam Triage Vital Signs ED Triage Vitals  Enc Vitals Group     BP 04/01/22 1544 121/64     Pulse Rate 04/01/22 1544 77     Resp 04/01/22 1544 18     Temp 04/01/22 1544 98.3 F (36.8 C)     Temp src --      SpO2 04/01/22 1544 100 %     Weight --      Height --      Head Circumference --      Peak Flow --      Pain Score 04/01/22 1542 5     Pain Loc --      Pain Edu? --      Excl. in [place]? --    No data found.  Updated Vital Signs BP 121/64  Pulse 77   Temp 98.3 F (36.8 C)   Resp 18   SpO2 100%   Physical Exam Vitals and nursing note reviewed.  Constitutional:      General: She is not in acute distress. Cardiovascular:     Rate and Rhythm: Normal rate and regular rhythm.     Pulses: Normal pulses.  Pulmonary:     Effort: Pulmonary effort is normal.  Musculoskeletal:     Right foot: No laceration.     Comments: Right distal toe with some bruising, minor swelling, ROM intact. Cap refill < 2 seconds, sensation intact. No midfoot pain, no bony tenderness of foot. No lac or nail damage noted  Skin:    General: Skin is warm and dry.     Capillary Refill: Capillary refill takes less than 2 seconds.     Findings: Bruising present.  Neurological:     Mental Status: She is alert and oriented to person, place, and time.      UC Treatments / Results  Labs (all labs ordered are listed, but only abnormal results are displayed) Labs Reviewed - No data to display  EKG   Radiology DG Foot Complete Right  Result Date: 04/01/2022 CLINICAL DATA:  Horse stepped on right foot. EXAM: RIGHT FOOT COMPLETE -  3+ VIEW COMPARISON:  None Available. FINDINGS: There is no evidence of fracture or dislocation. There is no evidence of arthropathy or other focal bone abnormality. Soft tissues are unremarkable. IMPRESSION: Negative. Electronically Signed   By: Ted Mcalpine M.D.   On: 04/01/2022 16:17    Procedures Procedures (including critical care time)  Medications Ordered in UC Medications - No data to display  Initial Impression / Assessment and Plan / UC Course  I have reviewed the triage vital signs and the nursing notes.  Pertinent labs & imaging results that were available during my care of the patient were reviewed by me and considered in my medical decision making (see chart for details).  Patient declined pregnancy test. Right foot xray negative. Discussed RICE therapy with ibuprofen Follow up with ortho as needed Return precautions discussed. Patient agrees to plan  Final Clinical Impressions(s) / UC Diagnoses   Final diagnoses:  Injury of toe on right foot, initial encounter  Soft tissue injury of toe of right foot, initial encounter     Discharge Instructions      Rest - try to avoid heavy lifting and high impact activity Ice - apply for 20 minutes a few times daily (wear socks!) Elevation - prop up on a pillow  You can take up to 800 mg ibuprofen every 6 hours as needed for pain and inflammation.  I recommend wearing supportive, closed toed shoes.  Follow up with the orthopedic specialists if symptoms persist despite medication.     ED Prescriptions   None    PDMP not reviewed this encounter.   Marlow Baars, [place] 04/01/22 1644

## 2022-04-01 NOTE — ED Triage Notes (Signed)
Pt reports her horse stepped on her RT foot . The area of Rt great toe.

## 2022-04-01 NOTE — ED Notes (Signed)
PT declines Urine Preg. test

## 2022-04-02 ENCOUNTER — Ambulatory Visit: Payer: Self-pay

## 2022-04-12 DIAGNOSIS — Z01419 Encounter for gynecological examination (general) (routine) without abnormal findings: Secondary | ICD-10-CM | POA: Diagnosis not present

## 2022-04-12 DIAGNOSIS — Z6832 Body mass index (BMI) 32.0-32.9, adult: Secondary | ICD-10-CM | POA: Diagnosis not present

## 2022-06-17 ENCOUNTER — Ambulatory Visit (HOSPITAL_COMMUNITY)
Admission: EM | Admit: 2022-06-17 | Discharge: 2022-06-17 | Disposition: A | Discharge status: Home / Self Care | Payer: BC Managed Care – PPO | Attending: Internal Medicine | Admitting: Internal Medicine

## 2022-06-17 ENCOUNTER — Other Ambulatory Visit: Payer: Self-pay

## 2022-06-17 ENCOUNTER — Ambulatory Visit (INDEPENDENT_AMBULATORY_CARE_PROVIDER_SITE_OTHER): Payer: BC Managed Care – PPO

## 2022-06-17 ENCOUNTER — Encounter (HOSPITAL_COMMUNITY): Payer: Self-pay | Admitting: *Deleted

## 2022-06-17 DIAGNOSIS — W5519XA Other contact with horse, initial encounter: Secondary | ICD-10-CM | POA: Diagnosis not present

## 2022-06-17 DIAGNOSIS — M25511 Pain in right shoulder: Secondary | ICD-10-CM

## 2022-06-17 DIAGNOSIS — T22131A Burn of first degree of right upper arm, initial encounter: Secondary | ICD-10-CM | POA: Diagnosis not present

## 2022-06-17 DIAGNOSIS — M25521 Pain in right elbow: Secondary | ICD-10-CM | POA: Diagnosis not present

## 2022-06-17 MED ORDER — SILVER SULFADIAZINE 1 % EX CREA
TOPICAL_CREAM | CUTANEOUS | Status: AC
  Filled 2022-06-17: qty 85

## 2022-06-17 MED ORDER — SILVER SULFADIAZINE 1 % EX CREA: TOPICAL_CREAM | Freq: Once | CUTANEOUS | Status: AC

## 2022-06-17 MED ORDER — SILVER SULFADIAZINE 1 % EX CREA: 1.0000 | TOPICAL_CREAM | Freq: Every day | CUTANEOUS | 0 refills | Status: AC

## 2022-06-17 MED ORDER — IBUPROFEN 800 MG PO TABS
800.0000 mg | ORAL_TABLET | Freq: Once | ORAL | Status: AC
  Administered 2022-06-17: 800 mg via ORAL

## 2022-06-17 MED ORDER — IBUPROFEN 600 MG PO TABS: 600.0000 mg | ORAL_TABLET | Freq: Four times a day (QID) | ORAL | 0 refills | Status: AC | PRN

## 2022-06-17 MED ORDER — IBUPROFEN 800 MG PO TABS
ORAL_TABLET | ORAL | Status: AC
  Filled 2022-06-17: qty 1

## 2022-06-17 NOTE — ED Provider Notes (Signed)
[facility]    CSN: [id number] Arrival date & time: 06/17/22  1017      History   Chief Complaint Chief Complaint  Patient presents with   Fall from Horse   Loss of Consciousness    HPI Holly Gallagher is a 26 y.o. female.   Patient presents urgent care for evaluation after she suffered a fall from her horse last night.  Horse bucked her off and she attempted an emergency dismount but fell onto her right side landing onto her right shoulder, right elbow, and right knee.  She is unsure if she hit her head, she states she was wearing a helmet at the time of the accident and her helmet was not dirty.  Patient was wearing pants and a longsleeve shirt at the time of the accident but suffered a burn appearing wound to the right deltoid area as well as an abrasion to the right thigh with bruising that developed this morning to the right thigh.  Currently experiencing pain to the right elbow, forehead, and right shoulder.  No numbness or tingling to the bilateral upper and lower extremities, neck pain, difficulty ambulating, blurry vision, nausea, vomiting, or dizziness.  She does not take blood thinning medications and denies history of chronic medical problems.  She has noticed a couple of spots to her forehead that appear swollen but attributes this to the compression from her riding helmet to her forehead.  She took Tylenol last night and this morning to help with pain with some relief.  Pain to the right shoulder and right elbow is currently a 2 or 3 on a scale of 0-10, worse with movement.  Denies chance of pregnancy, currently takes oral contraceptives as prescribed daily.     Past Medical History:  Diagnosis Date   Migraines    Ruptured lumbar disc    L4    Patient Active Problem List   Diagnosis Date Noted   Chronic migraine without aura with status migrainosus, not intractable 09/27/2020    Past Surgical History:  Procedure Laterality Date   NO PAST SURGERIES       OB History   No obstetric history on file.      Home Medications    Prior to Admission medications   Medication Sig Start Date End Date Taking? Authorizing Provider  ibuprofen (ADVIL) 600 MG tablet Take 1 tablet (600 mg total) by mouth every 6 (six) hours as needed. 06/17/22  Yes Carlisle Beers, FNP  silver sulfADIAZINE (SILVADENE) 1 % cream Apply 1 Application topically daily. 06/17/22  Yes Carlisle Beers, FNP  cetirizine (ZYRTEC) 10 MG tablet Take 10 mg by mouth daily. During spring and summer    [provider]  JUNEL FE 1.5/30 1.5-30 MG-MCG tablet Take 1 tablet by mouth daily. 08/11/20   [provider]  ondansetron (ZOFRAN-ODT) 4 MG disintegrating tablet Take 1-2 tablets (4-8 mg total) by mouth every 8 (eight) hours as needed for nausea. Also may take with Rizatriptan for migraine. 07/27/21   Ihor Austin, NP  rizatriptan (MAXALT-MLT) 10 MG disintegrating tablet Take 1 tablet (10 mg total) by mouth as needed for migraine. May repeat in 2 hours if needed, max dose 20mg /24 hrs 07/27/21   07/29/21, NP  topiramate (TOPAMAX) 100 MG tablet Take 1 tablet (100 mg total) by mouth at bedtime. 07/27/21   07/29/21, NP    Family History Family History  Problem Relation Age of Onset   High Cholesterol Mother  High blood pressure Father    Migraines Neg Hx     Social History Social History   Tobacco Use   Smoking status: Never   Smokeless tobacco: Never  Vaping Use   Vaping Use: Never used  Substance Use Topics   Alcohol use: Yes    Alcohol/week: 1.0 - 2.0 standard drink of alcohol    Types: 1 - 2 Cans of beer per week   Drug use: Never     Allergies   Patient has no known allergies.   Review of Systems Review of Systems Per HPI  Physical Exam Triage Vital Signs ED Triage Vitals  Enc Vitals Group     BP 06/17/22 1047 125/73     Pulse Rate 06/17/22 1047 81     Resp 06/17/22 1047 18     Temp 06/17/22 1047 98 F (36.7 C)      Temp src --      SpO2 06/17/22 1047 99 %     Weight --      Height --      Head Circumference --      Peak Flow --      Pain Score 06/17/22 1045 3     Pain Loc --      Pain Edu? --      Excl. in GC? --    No data found.  Updated Vital Signs BP 125/73   Pulse 81   Temp 98 F (36.7 C)   Resp 18   SpO2 99%   Visual Acuity Right Eye Distance:   Left Eye Distance:   Bilateral Distance:    Right Eye Near:   Left Eye Near:    Bilateral Near:     Physical Exam Vitals and nursing note reviewed.  Constitutional:      Appearance: Normal appearance. She is not ill-appearing or toxic-appearing.  HENT:     Head: Normocephalic and atraumatic.     Right Ear: Hearing, tympanic membrane, ear canal and external ear normal.     Left Ear: Hearing, tympanic membrane, ear canal and external ear normal.     Nose: Nose normal.     Mouth/Throat:     Lips: Pink.     Mouth: Mucous membranes are moist.     Pharynx: No posterior oropharyngeal erythema.  Eyes:     General: Lids are normal. Vision grossly intact. Gaze aligned appropriately.     Extraocular Movements: Extraocular movements intact.     Conjunctiva/sclera: Conjunctivae normal.  Neck:     Comments: No midline tenderness to the cervical spine, strength intact with bilateral head movement at the neck against resistance, sensation intact to bilateral upper extremities without change. Cardiovascular:     Rate and Rhythm: Normal rate and regular rhythm.     Heart sounds: Normal heart sounds, S1 normal and S2 normal.  Pulmonary:     Effort: Pulmonary effort is normal. No respiratory distress.     Breath sounds: Normal breath sounds and air entry.  Musculoskeletal:     Right shoulder: Swelling and tenderness present. No deformity, effusion, laceration, bony tenderness or crepitus. Normal range of motion. Normal strength. Normal pulse.     Left shoulder: Normal.     Right elbow: No swelling or deformity. Normal range of motion.  Tenderness present.     Right forearm: Normal.     Left forearm: Normal.     Cervical back: Normal range of motion and neck supple.     Comments: Full range  of motion of the right shoulder despite tenderness, full range of motion of the right elbow despite tenderness.  5/5 strength to bilateral upper extremities with grip strength.  Sensation intact.  +2 radial pulses bilaterally.  Lymphadenopathy:     Cervical: No cervical adenopathy.  Skin:    General: Skin is warm and dry.     Capillary Refill: Capillary refill takes less than 2 seconds.     Findings: No rash.     Comments: Erythematous and weeping appearing wound to the right upper extremity at the deltoid region.  See image below for details.  Area is warm to the touch and currently draining clear fluid to central part of wound.  Abrasion and ecchymosis present to the right medial thigh.  Area is nondraining/bleeding.  Patient ambulatory with steady gait without difficulty.  Neurological:     General: No focal deficit present.     Mental Status: She is alert and oriented to person, place, and time. Mental status is at baseline.     Cranial Nerves: Cranial nerves 2-12 are intact. No dysarthria or facial asymmetry.     Sensory: Sensation is intact.     Motor: Motor function is intact.     Coordination: Coordination is intact.     Gait: Gait is intact.     Comments: Nonfocal neurologic exam.  EOMs are intact without dizziness or pain elicited.  Psychiatric:        Mood and Affect: Mood normal.        Speech: Speech normal.        Behavior: Behavior normal.        Thought Content: Thought content normal.        Judgment: Judgment normal.        UC Treatments / Results  Labs (all labs ordered are listed, but only abnormal results are displayed) Labs Reviewed - No data to display  EKG   Radiology DG Shoulder Right  Result Date: 06/17/2022 CLINICAL DATA:  Fall from a horse.  Right shoulder pain. EXAM: RIGHT SHOULDER - 2+  VIEW COMPARISON:  None Available. FINDINGS: There is no evidence of fracture or dislocation. There is no evidence of arthropathy or other focal bone abnormality. Soft tissues are unremarkable. IMPRESSION: Negative. Electronically Signed   By: Amie Portlandavid  Ormond M.D.   On: 06/17/2022 11:15   DG Elbow Complete Right  Result Date: 06/17/2022 CLINICAL DATA:  Larey SeatFell from a horse.  Right elbow pain. EXAM: RIGHT ELBOW - COMPLETE 3+ VIEW COMPARISON:  None Available. FINDINGS: There is no evidence of fracture, dislocation, or joint effusion. There is no evidence of arthropathy or other focal bone abnormality. Soft tissues are unremarkable. IMPRESSION: Negative. Electronically Signed   By: Amie Portlandavid  Ormond M.D.   On: 06/17/2022 11:14    Procedures Procedures (including critical care time)  Medications Ordered in UC Medications  ibuprofen (ADVIL) tablet 800 mg (800 mg Oral Given 06/17/22 1059)  silver sulfADIAZINE (SILVADENE) 1 % cream ( Topical Given 06/17/22 1133)    Initial Impression / Assessment and Plan / UC Course  I have reviewed the triage vital signs and the nursing notes.  Pertinent labs & imaging results that were available during my care of the patient were reviewed by me and considered in my medical decision making (see chart for details).   1.  Fall from horse, acute pain of the right shoulder and right elbow, superficial burn of the right upper arm Imaging of the right shoulder and right elbow are both  negative for acute bony abnormalities.  Wound to the right deltoid is consistent with a friction burn.  We will treat this with Silvadene twice daily for the next 7 days to reduce inflammation and improve burn.  Strict infection return precautions discussed, patient voices understanding.  Silvadene cream and nonstick gauze applied in clinic.  Patient provided prescription for Silvadene cream so that she can use this at home for the next 7 days.  Patient is to keep the wound covered with nonstick gauze  and tape over the next 7 days as the wound heals further.  No indication for imaging of the head based on [place] trauma CT scan rules.  Neurologic exam is intact without focal deficit.  Patient given ibuprofen 800 mg in clinic for acute pain, she states this has improved her pain significantly in the time she has been urgent care.  She may continue ibuprofen 600 mg every 6 hours as needed at home for pain and inflammation.  Advised ice to the areas of greatest tenderness over the next 24 to 48 hours, then may switch to heat to relieve muscular tension ongoing should she experience this.  She is to follow-up with primary care provider in the next few days should symptoms fail to improve.   Discussed physical exam and available lab work findings in clinic with patient.  Counseled patient regarding appropriate use of medications and potential side effects for all medications recommended or prescribed today. Discussed red flag signs and symptoms of worsening condition,when to call the PCP office, return to urgent care, and when to seek higher level of care in the emergency department. Patient verbalizes understanding and agreement with plan. All questions answered. Patient discharged in stable condition.    Final Clinical Impressions(s) / UC Diagnoses   Final diagnoses:  Superficial burn of right upper arm, initial encounter  Fall from horse, initial encounter  Acute pain of right shoulder  Right elbow pain     Discharge Instructions      Your x-rays of your shoulder and elbow are negative.  This is very reassuring. Continue taking ibuprofen 600 mg every 6 hours as needed for pain and inflammation.  You may also take Tylenol 1000 mg every 6 hours as needed for pain.  Apply ice to the shoulder and elbow for the first 24 to 48 hours after the fall to reduce inflammation, then you may switch over to heat to help relieve muscle tension.   Apply Silvadene cream to the burn to the right arm/shoulder  region 2 times a day for the next 5 to 7 days. If you notice that the area is starting to drain or weep white/yellow fluid, or if the area becomes more painful, warm to the touch, or red, I would like for you to return to urgent care for reevaluation as this may mean that you are developing an infection to the site.  If you develop any new or worsening symptoms or do not improve in the next 2 to 3 days, please return.  If your symptoms are severe, please go to the emergency room.  Follow-up with your primary care provider for further evaluation and management of your symptoms as well as ongoing wellness visits.  I hope you feel better!     ED Prescriptions     Medication Sig Dispense Auth. Provider   silver sulfADIAZINE (SILVADENE) 1 % cream Apply 1 Application topically daily. 50 g Reita May M, FNP   ibuprofen (ADVIL) 600 MG tablet Take 1  tablet (600 mg total) by mouth every 6 (six) hours as needed. 30 tablet Carlisle Beers, FNP      PDMP not reviewed this encounter.   Carlisle Beers, [place] 06/17/22 1154

## 2022-06-17 NOTE — Discharge Instructions (Addendum)
Your x-rays of your shoulder and elbow are negative.  This is very reassuring. Continue taking ibuprofen 600 mg every 6 hours as needed for pain and inflammation.  You may also take Tylenol 1000 mg every 6 hours as needed for pain.  Apply ice to the shoulder and elbow for the first 24 to 48 hours after the fall to reduce inflammation, then you may switch over to heat to help relieve muscle tension.   Apply Silvadene cream to the burn to the right arm/shoulder region 2 times a day for the next 5 to 7 days. If you notice that the area is starting to drain or weep white/yellow fluid, or if the area becomes more painful, warm to the touch, or red, I would like for you to return to urgent care for reevaluation as this may mean that you are developing an infection to the site.  If you develop any new or worsening symptoms or do not improve in the next 2 to 3 days, please return.  If your symptoms are severe, please go to the emergency room.  Follow-up with your primary care provider for further evaluation and management of your symptoms as well as ongoing wellness visits.  I hope you feel better!

## 2022-06-17 NOTE — ED Triage Notes (Signed)
Pt reports she was bucked off a horse last night . Pt reports impact to Rt side of head upon landing on dirt surface. Pt has a <1 HA today. Pt denies any vision changes. Pt denies any N/V. Pt also has RT elbow pain . She also has bruise to rt thigh and Rt shoulder.

## 2022-07-12 DIAGNOSIS — Z79899 Other long term (current) drug therapy: Secondary | ICD-10-CM | POA: Diagnosis not present

## 2022-07-12 DIAGNOSIS — M5416 Radiculopathy, lumbar region: Secondary | ICD-10-CM | POA: Diagnosis not present

## 2022-07-26 DIAGNOSIS — Z1331 Encounter for screening for depression: Secondary | ICD-10-CM | POA: Diagnosis not present

## 2022-07-26 DIAGNOSIS — Z1339 Encounter for screening examination for other mental health and behavioral disorders: Secondary | ICD-10-CM | POA: Diagnosis not present

## 2022-07-26 DIAGNOSIS — Z0000 Encounter for general adult medical examination without abnormal findings: Secondary | ICD-10-CM | POA: Diagnosis not present

## 2023-04-18 DIAGNOSIS — Z01419 Encounter for gynecological examination (general) (routine) without abnormal findings: Secondary | ICD-10-CM | POA: Diagnosis not present

## 2023-04-21 DIAGNOSIS — Z23 Encounter for immunization: Secondary | ICD-10-CM | POA: Diagnosis not present

## 2023-07-25 DIAGNOSIS — Z0000 Encounter for general adult medical examination without abnormal findings: Secondary | ICD-10-CM | POA: Diagnosis not present

## 2023-07-25 DIAGNOSIS — Z79899 Other long term (current) drug therapy: Secondary | ICD-10-CM | POA: Diagnosis not present

## 2023-09-05 DIAGNOSIS — Z1339 Encounter for screening examination for other mental health and behavioral disorders: Secondary | ICD-10-CM | POA: Diagnosis not present

## 2023-09-05 DIAGNOSIS — Z1331 Encounter for screening for depression: Secondary | ICD-10-CM | POA: Diagnosis not present

## 2023-09-05 DIAGNOSIS — Z0000 Encounter for general adult medical examination without abnormal findings: Secondary | ICD-10-CM | POA: Diagnosis not present

## 2024-01-02 DIAGNOSIS — M9901 Segmental and somatic dysfunction of cervical region: Secondary | ICD-10-CM | POA: Diagnosis not present

## 2024-01-02 DIAGNOSIS — M9903 Segmental and somatic dysfunction of lumbar region: Secondary | ICD-10-CM | POA: Diagnosis not present

## 2024-01-02 DIAGNOSIS — M545 Low back pain, unspecified: Secondary | ICD-10-CM | POA: Diagnosis not present

## 2024-01-02 DIAGNOSIS — M542 Cervicalgia: Secondary | ICD-10-CM | POA: Diagnosis not present

## 2024-01-09 DIAGNOSIS — M9903 Segmental and somatic dysfunction of lumbar region: Secondary | ICD-10-CM | POA: Diagnosis not present

## 2024-01-09 DIAGNOSIS — M545 Low back pain, unspecified: Secondary | ICD-10-CM | POA: Diagnosis not present

## 2024-01-09 DIAGNOSIS — M9901 Segmental and somatic dysfunction of cervical region: Secondary | ICD-10-CM | POA: Diagnosis not present

## 2024-01-09 DIAGNOSIS — M542 Cervicalgia: Secondary | ICD-10-CM | POA: Diagnosis not present

## 2024-01-10 DIAGNOSIS — M9901 Segmental and somatic dysfunction of cervical region: Secondary | ICD-10-CM | POA: Diagnosis not present

## 2024-01-10 DIAGNOSIS — M545 Low back pain, unspecified: Secondary | ICD-10-CM | POA: Diagnosis not present

## 2024-01-10 DIAGNOSIS — M542 Cervicalgia: Secondary | ICD-10-CM | POA: Diagnosis not present

## 2024-01-10 DIAGNOSIS — M9903 Segmental and somatic dysfunction of lumbar region: Secondary | ICD-10-CM | POA: Diagnosis not present

## 2024-01-14 DIAGNOSIS — M9903 Segmental and somatic dysfunction of lumbar region: Secondary | ICD-10-CM | POA: Diagnosis not present

## 2024-01-14 DIAGNOSIS — M9901 Segmental and somatic dysfunction of cervical region: Secondary | ICD-10-CM | POA: Diagnosis not present

## 2024-01-14 DIAGNOSIS — M545 Low back pain, unspecified: Secondary | ICD-10-CM | POA: Diagnosis not present

## 2024-01-14 DIAGNOSIS — M542 Cervicalgia: Secondary | ICD-10-CM | POA: Diagnosis not present

## 2024-01-16 DIAGNOSIS — M545 Low back pain, unspecified: Secondary | ICD-10-CM | POA: Diagnosis not present

## 2024-01-16 DIAGNOSIS — M9901 Segmental and somatic dysfunction of cervical region: Secondary | ICD-10-CM | POA: Diagnosis not present

## 2024-01-16 DIAGNOSIS — M9903 Segmental and somatic dysfunction of lumbar region: Secondary | ICD-10-CM | POA: Diagnosis not present

## 2024-01-16 DIAGNOSIS — M542 Cervicalgia: Secondary | ICD-10-CM | POA: Diagnosis not present

## 2024-01-17 DIAGNOSIS — M542 Cervicalgia: Secondary | ICD-10-CM | POA: Diagnosis not present

## 2024-01-17 DIAGNOSIS — M9903 Segmental and somatic dysfunction of lumbar region: Secondary | ICD-10-CM | POA: Diagnosis not present

## 2024-01-17 DIAGNOSIS — M9901 Segmental and somatic dysfunction of cervical region: Secondary | ICD-10-CM | POA: Diagnosis not present

## 2024-01-17 DIAGNOSIS — M545 Low back pain, unspecified: Secondary | ICD-10-CM | POA: Diagnosis not present

## 2024-01-21 DIAGNOSIS — M545 Low back pain, unspecified: Secondary | ICD-10-CM | POA: Diagnosis not present

## 2024-01-21 DIAGNOSIS — M542 Cervicalgia: Secondary | ICD-10-CM | POA: Diagnosis not present

## 2024-01-21 DIAGNOSIS — M9901 Segmental and somatic dysfunction of cervical region: Secondary | ICD-10-CM | POA: Diagnosis not present

## 2024-01-21 DIAGNOSIS — M9903 Segmental and somatic dysfunction of lumbar region: Secondary | ICD-10-CM | POA: Diagnosis not present

## 2024-01-23 DIAGNOSIS — M545 Low back pain, unspecified: Secondary | ICD-10-CM | POA: Diagnosis not present

## 2024-01-23 DIAGNOSIS — M9901 Segmental and somatic dysfunction of cervical region: Secondary | ICD-10-CM | POA: Diagnosis not present

## 2024-01-23 DIAGNOSIS — M542 Cervicalgia: Secondary | ICD-10-CM | POA: Diagnosis not present

## 2024-01-23 DIAGNOSIS — M9903 Segmental and somatic dysfunction of lumbar region: Secondary | ICD-10-CM | POA: Diagnosis not present

## 2024-01-24 DIAGNOSIS — M9903 Segmental and somatic dysfunction of lumbar region: Secondary | ICD-10-CM | POA: Diagnosis not present

## 2024-01-24 DIAGNOSIS — M545 Low back pain, unspecified: Secondary | ICD-10-CM | POA: Diagnosis not present

## 2024-01-24 DIAGNOSIS — M9901 Segmental and somatic dysfunction of cervical region: Secondary | ICD-10-CM | POA: Diagnosis not present

## 2024-01-24 DIAGNOSIS — M542 Cervicalgia: Secondary | ICD-10-CM | POA: Diagnosis not present

## 2024-01-28 DIAGNOSIS — M9901 Segmental and somatic dysfunction of cervical region: Secondary | ICD-10-CM | POA: Diagnosis not present

## 2024-01-28 DIAGNOSIS — M545 Low back pain, unspecified: Secondary | ICD-10-CM | POA: Diagnosis not present

## 2024-01-28 DIAGNOSIS — M542 Cervicalgia: Secondary | ICD-10-CM | POA: Diagnosis not present

## 2024-01-28 DIAGNOSIS — M9903 Segmental and somatic dysfunction of lumbar region: Secondary | ICD-10-CM | POA: Diagnosis not present

## 2024-01-29 DIAGNOSIS — M9903 Segmental and somatic dysfunction of lumbar region: Secondary | ICD-10-CM | POA: Diagnosis not present

## 2024-01-29 DIAGNOSIS — M542 Cervicalgia: Secondary | ICD-10-CM | POA: Diagnosis not present

## 2024-01-29 DIAGNOSIS — M9901 Segmental and somatic dysfunction of cervical region: Secondary | ICD-10-CM | POA: Diagnosis not present

## 2024-01-29 DIAGNOSIS — M545 Low back pain, unspecified: Secondary | ICD-10-CM | POA: Diagnosis not present

## 2024-01-30 DIAGNOSIS — M9903 Segmental and somatic dysfunction of lumbar region: Secondary | ICD-10-CM | POA: Diagnosis not present

## 2024-01-30 DIAGNOSIS — M542 Cervicalgia: Secondary | ICD-10-CM | POA: Diagnosis not present

## 2024-01-30 DIAGNOSIS — M545 Low back pain, unspecified: Secondary | ICD-10-CM | POA: Diagnosis not present

## 2024-01-30 DIAGNOSIS — M9901 Segmental and somatic dysfunction of cervical region: Secondary | ICD-10-CM | POA: Diagnosis not present

## 2024-02-04 DIAGNOSIS — M9903 Segmental and somatic dysfunction of lumbar region: Secondary | ICD-10-CM | POA: Diagnosis not present

## 2024-02-04 DIAGNOSIS — M542 Cervicalgia: Secondary | ICD-10-CM | POA: Diagnosis not present

## 2024-02-04 DIAGNOSIS — M545 Low back pain, unspecified: Secondary | ICD-10-CM | POA: Diagnosis not present

## 2024-02-04 DIAGNOSIS — M9901 Segmental and somatic dysfunction of cervical region: Secondary | ICD-10-CM | POA: Diagnosis not present

## 2024-02-06 DIAGNOSIS — M9903 Segmental and somatic dysfunction of lumbar region: Secondary | ICD-10-CM | POA: Diagnosis not present

## 2024-02-06 DIAGNOSIS — M542 Cervicalgia: Secondary | ICD-10-CM | POA: Diagnosis not present

## 2024-02-06 DIAGNOSIS — M545 Low back pain, unspecified: Secondary | ICD-10-CM | POA: Diagnosis not present

## 2024-02-06 DIAGNOSIS — M9901 Segmental and somatic dysfunction of cervical region: Secondary | ICD-10-CM | POA: Diagnosis not present

## 2024-02-07 DIAGNOSIS — M9901 Segmental and somatic dysfunction of cervical region: Secondary | ICD-10-CM | POA: Diagnosis not present

## 2024-02-07 DIAGNOSIS — M542 Cervicalgia: Secondary | ICD-10-CM | POA: Diagnosis not present

## 2024-02-07 DIAGNOSIS — M9903 Segmental and somatic dysfunction of lumbar region: Secondary | ICD-10-CM | POA: Diagnosis not present

## 2024-02-07 DIAGNOSIS — M545 Low back pain, unspecified: Secondary | ICD-10-CM | POA: Diagnosis not present

## 2024-02-11 DIAGNOSIS — M545 Low back pain, unspecified: Secondary | ICD-10-CM | POA: Diagnosis not present

## 2024-02-11 DIAGNOSIS — M9903 Segmental and somatic dysfunction of lumbar region: Secondary | ICD-10-CM | POA: Diagnosis not present

## 2024-02-11 DIAGNOSIS — M542 Cervicalgia: Secondary | ICD-10-CM | POA: Diagnosis not present

## 2024-02-11 DIAGNOSIS — M9901 Segmental and somatic dysfunction of cervical region: Secondary | ICD-10-CM | POA: Diagnosis not present

## 2024-02-13 DIAGNOSIS — M9903 Segmental and somatic dysfunction of lumbar region: Secondary | ICD-10-CM | POA: Diagnosis not present

## 2024-02-13 DIAGNOSIS — M545 Low back pain, unspecified: Secondary | ICD-10-CM | POA: Diagnosis not present

## 2024-02-13 DIAGNOSIS — M9901 Segmental and somatic dysfunction of cervical region: Secondary | ICD-10-CM | POA: Diagnosis not present

## 2024-02-13 DIAGNOSIS — M542 Cervicalgia: Secondary | ICD-10-CM | POA: Diagnosis not present

## 2024-02-18 DIAGNOSIS — M542 Cervicalgia: Secondary | ICD-10-CM | POA: Diagnosis not present

## 2024-02-18 DIAGNOSIS — M545 Low back pain, unspecified: Secondary | ICD-10-CM | POA: Diagnosis not present

## 2024-02-18 DIAGNOSIS — M9903 Segmental and somatic dysfunction of lumbar region: Secondary | ICD-10-CM | POA: Diagnosis not present

## 2024-02-18 DIAGNOSIS — M9901 Segmental and somatic dysfunction of cervical region: Secondary | ICD-10-CM | POA: Diagnosis not present

## 2024-02-20 DIAGNOSIS — M9903 Segmental and somatic dysfunction of lumbar region: Secondary | ICD-10-CM | POA: Diagnosis not present

## 2024-02-20 DIAGNOSIS — M545 Low back pain, unspecified: Secondary | ICD-10-CM | POA: Diagnosis not present

## 2024-02-20 DIAGNOSIS — M9901 Segmental and somatic dysfunction of cervical region: Secondary | ICD-10-CM | POA: Diagnosis not present

## 2024-02-20 DIAGNOSIS — M542 Cervicalgia: Secondary | ICD-10-CM | POA: Diagnosis not present

## 2024-02-25 DIAGNOSIS — M9903 Segmental and somatic dysfunction of lumbar region: Secondary | ICD-10-CM | POA: Diagnosis not present

## 2024-02-25 DIAGNOSIS — M9901 Segmental and somatic dysfunction of cervical region: Secondary | ICD-10-CM | POA: Diagnosis not present

## 2024-02-25 DIAGNOSIS — M542 Cervicalgia: Secondary | ICD-10-CM | POA: Diagnosis not present

## 2024-02-25 DIAGNOSIS — M545 Low back pain, unspecified: Secondary | ICD-10-CM | POA: Diagnosis not present

## 2024-02-27 DIAGNOSIS — M9901 Segmental and somatic dysfunction of cervical region: Secondary | ICD-10-CM | POA: Diagnosis not present

## 2024-02-27 DIAGNOSIS — M9903 Segmental and somatic dysfunction of lumbar region: Secondary | ICD-10-CM | POA: Diagnosis not present

## 2024-02-27 DIAGNOSIS — M542 Cervicalgia: Secondary | ICD-10-CM | POA: Diagnosis not present

## 2024-02-27 DIAGNOSIS — M545 Low back pain, unspecified: Secondary | ICD-10-CM | POA: Diagnosis not present

## 2024-03-03 DIAGNOSIS — M9901 Segmental and somatic dysfunction of cervical region: Secondary | ICD-10-CM | POA: Diagnosis not present

## 2024-03-03 DIAGNOSIS — M9903 Segmental and somatic dysfunction of lumbar region: Secondary | ICD-10-CM | POA: Diagnosis not present

## 2024-03-03 DIAGNOSIS — M542 Cervicalgia: Secondary | ICD-10-CM | POA: Diagnosis not present

## 2024-03-03 DIAGNOSIS — M545 Low back pain, unspecified: Secondary | ICD-10-CM | POA: Diagnosis not present

## 2024-03-05 DIAGNOSIS — M542 Cervicalgia: Secondary | ICD-10-CM | POA: Diagnosis not present

## 2024-03-05 DIAGNOSIS — M545 Low back pain, unspecified: Secondary | ICD-10-CM | POA: Diagnosis not present

## 2024-03-05 DIAGNOSIS — M9901 Segmental and somatic dysfunction of cervical region: Secondary | ICD-10-CM | POA: Diagnosis not present

## 2024-03-05 DIAGNOSIS — M9903 Segmental and somatic dysfunction of lumbar region: Secondary | ICD-10-CM | POA: Diagnosis not present

## 2024-03-11 DIAGNOSIS — M9901 Segmental and somatic dysfunction of cervical region: Secondary | ICD-10-CM | POA: Diagnosis not present

## 2024-03-11 DIAGNOSIS — M9903 Segmental and somatic dysfunction of lumbar region: Secondary | ICD-10-CM | POA: Diagnosis not present

## 2024-03-11 DIAGNOSIS — M542 Cervicalgia: Secondary | ICD-10-CM | POA: Diagnosis not present

## 2024-03-11 DIAGNOSIS — M545 Low back pain, unspecified: Secondary | ICD-10-CM | POA: Diagnosis not present

## 2024-03-12 DIAGNOSIS — M542 Cervicalgia: Secondary | ICD-10-CM | POA: Diagnosis not present

## 2024-03-12 DIAGNOSIS — M545 Low back pain, unspecified: Secondary | ICD-10-CM | POA: Diagnosis not present

## 2024-03-12 DIAGNOSIS — M9901 Segmental and somatic dysfunction of cervical region: Secondary | ICD-10-CM | POA: Diagnosis not present

## 2024-03-12 DIAGNOSIS — M9903 Segmental and somatic dysfunction of lumbar region: Secondary | ICD-10-CM | POA: Diagnosis not present

## 2024-03-17 DIAGNOSIS — M542 Cervicalgia: Secondary | ICD-10-CM | POA: Diagnosis not present

## 2024-03-17 DIAGNOSIS — M9901 Segmental and somatic dysfunction of cervical region: Secondary | ICD-10-CM | POA: Diagnosis not present

## 2024-03-17 DIAGNOSIS — M545 Low back pain, unspecified: Secondary | ICD-10-CM | POA: Diagnosis not present

## 2024-03-17 DIAGNOSIS — M9903 Segmental and somatic dysfunction of lumbar region: Secondary | ICD-10-CM | POA: Diagnosis not present

## 2024-03-19 DIAGNOSIS — M9903 Segmental and somatic dysfunction of lumbar region: Secondary | ICD-10-CM | POA: Diagnosis not present

## 2024-03-19 DIAGNOSIS — M545 Low back pain, unspecified: Secondary | ICD-10-CM | POA: Diagnosis not present

## 2024-03-19 DIAGNOSIS — M542 Cervicalgia: Secondary | ICD-10-CM | POA: Diagnosis not present

## 2024-03-19 DIAGNOSIS — M9901 Segmental and somatic dysfunction of cervical region: Secondary | ICD-10-CM | POA: Diagnosis not present

## 2024-03-31 DIAGNOSIS — M542 Cervicalgia: Secondary | ICD-10-CM | POA: Diagnosis not present

## 2024-03-31 DIAGNOSIS — M9901 Segmental and somatic dysfunction of cervical region: Secondary | ICD-10-CM | POA: Diagnosis not present

## 2024-03-31 DIAGNOSIS — M545 Low back pain, unspecified: Secondary | ICD-10-CM | POA: Diagnosis not present

## 2024-04-02 DIAGNOSIS — M545 Low back pain, unspecified: Secondary | ICD-10-CM | POA: Diagnosis not present

## 2024-04-02 DIAGNOSIS — M9903 Segmental and somatic dysfunction of lumbar region: Secondary | ICD-10-CM | POA: Diagnosis not present

## 2024-04-02 DIAGNOSIS — M9901 Segmental and somatic dysfunction of cervical region: Secondary | ICD-10-CM | POA: Diagnosis not present

## 2024-04-02 DIAGNOSIS — M542 Cervicalgia: Secondary | ICD-10-CM | POA: Diagnosis not present

## 2024-04-10 DIAGNOSIS — M545 Low back pain, unspecified: Secondary | ICD-10-CM | POA: Diagnosis not present

## 2024-04-10 DIAGNOSIS — M9901 Segmental and somatic dysfunction of cervical region: Secondary | ICD-10-CM | POA: Diagnosis not present

## 2024-04-10 DIAGNOSIS — M542 Cervicalgia: Secondary | ICD-10-CM | POA: Diagnosis not present

## 2024-04-10 DIAGNOSIS — M9903 Segmental and somatic dysfunction of lumbar region: Secondary | ICD-10-CM | POA: Diagnosis not present

## 2024-04-23 DIAGNOSIS — Z01419 Encounter for gynecological examination (general) (routine) without abnormal findings: Secondary | ICD-10-CM | POA: Diagnosis not present

## 2024-04-23 DIAGNOSIS — Z124 Encounter for screening for malignant neoplasm of cervix: Secondary | ICD-10-CM | POA: Diagnosis not present

## 2024-04-26 DIAGNOSIS — Z23 Encounter for immunization: Secondary | ICD-10-CM | POA: Diagnosis not present
